# Patient Record
Sex: Female | Born: 1965 | Race: White | Hispanic: No | Smoking: Never smoker
Health system: Southern US, Community
[De-identification: ages and names within clinical notes are randomized; demographics above are authoritative.]

## PROBLEM LIST (undated history)

## (undated) DIAGNOSIS — R112 Nausea with vomiting, unspecified: Secondary | ICD-10-CM

## (undated) DIAGNOSIS — M1990 Unspecified osteoarthritis, unspecified site: Secondary | ICD-10-CM

## (undated) DIAGNOSIS — G43909 Migraine, unspecified, not intractable, without status migrainosus: Secondary | ICD-10-CM

## (undated) DIAGNOSIS — F329 Major depressive disorder, single episode, unspecified: Secondary | ICD-10-CM

## (undated) DIAGNOSIS — E538 Deficiency of other specified B group vitamins: Principal | ICD-10-CM

## (undated) DIAGNOSIS — D509 Iron deficiency anemia, unspecified: Secondary | ICD-10-CM

## (undated) DIAGNOSIS — L80 Vitiligo: Secondary | ICD-10-CM

## (undated) DIAGNOSIS — Z803 Family history of malignant neoplasm of breast: Secondary | ICD-10-CM

## (undated) DIAGNOSIS — Z8049 Family history of malignant neoplasm of other genital organs: Secondary | ICD-10-CM

## (undated) DIAGNOSIS — R413 Other amnesia: Principal | ICD-10-CM

## (undated) DIAGNOSIS — K900 Celiac disease: Secondary | ICD-10-CM

## (undated) DIAGNOSIS — E039 Hypothyroidism, unspecified: Secondary | ICD-10-CM

## (undated) DIAGNOSIS — Z800 Family history of malignant neoplasm of digestive organs: Secondary | ICD-10-CM

## (undated) DIAGNOSIS — E786 Lipoprotein deficiency: Secondary | ICD-10-CM

## (undated) DIAGNOSIS — Z9889 Other specified postprocedural states: Secondary | ICD-10-CM

## (undated) DIAGNOSIS — C801 Malignant (primary) neoplasm, unspecified: Secondary | ICD-10-CM

## (undated) DIAGNOSIS — C8590 Non-Hodgkin lymphoma, unspecified, unspecified site: Secondary | ICD-10-CM

## (undated) DIAGNOSIS — M797 Fibromyalgia: Secondary | ICD-10-CM

## (undated) DIAGNOSIS — F32A Depression, unspecified: Secondary | ICD-10-CM

## (undated) HISTORY — DX: Hypothyroidism, unspecified: E03.9

## (undated) HISTORY — DX: Celiac disease: K90.0

## (undated) HISTORY — PX: LUMBAR DISC SURGERY: SHX700

## (undated) HISTORY — DX: Unspecified osteoarthritis, unspecified site: M19.90

## (undated) HISTORY — DX: Other amnesia: R41.3

## (undated) HISTORY — DX: Deficiency of other specified B group vitamins: E53.8

## (undated) HISTORY — DX: Family history of malignant neoplasm of breast: Z80.3

## (undated) HISTORY — DX: Lipoprotein deficiency: E78.6

## (undated) HISTORY — DX: Nausea with vomiting, unspecified: R11.2

## (undated) HISTORY — PX: BACK SURGERY: SHX140

## (undated) HISTORY — DX: Family history of malignant neoplasm of digestive organs: Z80.0

## (undated) HISTORY — PX: TUBAL LIGATION: SHX77

## (undated) HISTORY — DX: Depression, unspecified: F32.A

## (undated) HISTORY — DX: Non-Hodgkin lymphoma, unspecified, unspecified site: C85.90

## (undated) HISTORY — PX: COLONOSCOPY: SHX174

## (undated) HISTORY — DX: Vitiligo: L80

## (undated) HISTORY — DX: Migraine, unspecified, not intractable, without status migrainosus: G43.909

## (undated) HISTORY — DX: Fibromyalgia: M79.7

## (undated) HISTORY — PX: INCONTINENCE SURGERY: SHX676

## (undated) HISTORY — PX: ESOPHAGOGASTRODUODENOSCOPY: SHX1529

## (undated) HISTORY — DX: Iron deficiency anemia, unspecified: D50.9

## (undated) HISTORY — DX: Family history of malignant neoplasm of other genital organs: Z80.49

## (undated) HISTORY — DX: Other specified postprocedural states: Z98.890

## (undated) HISTORY — DX: Major depressive disorder, single episode, unspecified: F32.9

## (undated) HISTORY — DX: Malignant (primary) neoplasm, unspecified: C80.1

---

## 1998-01-21 ENCOUNTER — Other Ambulatory Visit: Admission: RE | Admit: 1998-01-21 | Discharge: 1998-01-21 | Payer: Self-pay | Admitting: Gynecology

## 1998-08-07 ENCOUNTER — Other Ambulatory Visit: Admission: RE | Admit: 1998-08-07 | Discharge: 1998-08-07 | Payer: Self-pay | Admitting: Gynecology

## 1998-08-08 ENCOUNTER — Other Ambulatory Visit: Admission: RE | Admit: 1998-08-08 | Discharge: 1998-08-08 | Payer: Self-pay | Admitting: Gynecology

## 1998-12-04 ENCOUNTER — Ambulatory Visit: Admission: RE | Admit: 1998-12-04 | Discharge: 1998-12-04 | Payer: Self-pay | Admitting: Family Medicine

## 1998-12-10 ENCOUNTER — Other Ambulatory Visit: Admission: RE | Admit: 1998-12-10 | Discharge: 1998-12-10 | Payer: Self-pay | Admitting: Otolaryngology

## 1998-12-13 ENCOUNTER — Other Ambulatory Visit: Admission: RE | Admit: 1998-12-13 | Discharge: 1998-12-13 | Payer: Self-pay | Admitting: Otolaryngology

## 1998-12-24 ENCOUNTER — Other Ambulatory Visit: Admission: RE | Admit: 1998-12-24 | Discharge: 1998-12-24 | Payer: Self-pay | Admitting: Otolaryngology

## 1999-01-06 ENCOUNTER — Encounter: Payer: Self-pay | Admitting: Otolaryngology

## 1999-01-06 ENCOUNTER — Ambulatory Visit (HOSPITAL_COMMUNITY): Admission: RE | Admit: 1999-01-06 | Discharge: 1999-01-06 | Payer: Self-pay | Admitting: Otolaryngology

## 1999-03-12 ENCOUNTER — Other Ambulatory Visit: Admission: RE | Admit: 1999-03-12 | Discharge: 1999-03-12 | Payer: Self-pay | Admitting: Gynecology

## 1999-04-22 ENCOUNTER — Encounter: Payer: Self-pay | Admitting: Otolaryngology

## 1999-04-22 ENCOUNTER — Ambulatory Visit (HOSPITAL_COMMUNITY): Admission: RE | Admit: 1999-04-22 | Discharge: 1999-04-22 | Payer: Self-pay | Admitting: Otolaryngology

## 1999-09-23 ENCOUNTER — Other Ambulatory Visit: Admission: RE | Admit: 1999-09-23 | Discharge: 1999-09-23 | Payer: Self-pay | Admitting: Gynecology

## 2000-01-14 ENCOUNTER — Encounter: Payer: Self-pay | Admitting: Obstetrics and Gynecology

## 2000-01-14 ENCOUNTER — Ambulatory Visit (HOSPITAL_COMMUNITY): Admission: RE | Admit: 2000-01-14 | Discharge: 2000-01-14 | Payer: Self-pay | Admitting: Obstetrics and Gynecology

## 2000-02-09 ENCOUNTER — Ambulatory Visit (HOSPITAL_COMMUNITY): Admission: RE | Admit: 2000-02-09 | Discharge: 2000-02-09 | Payer: Self-pay | Admitting: Obstetrics and Gynecology

## 2000-02-09 ENCOUNTER — Encounter: Payer: Self-pay | Admitting: Obstetrics and Gynecology

## 2000-03-17 ENCOUNTER — Other Ambulatory Visit: Admission: RE | Admit: 2000-03-17 | Discharge: 2000-03-17 | Payer: Self-pay | Admitting: Obstetrics and Gynecology

## 2000-03-19 ENCOUNTER — Encounter: Payer: Self-pay | Admitting: Obstetrics and Gynecology

## 2000-03-19 ENCOUNTER — Ambulatory Visit (HOSPITAL_COMMUNITY): Admission: RE | Admit: 2000-03-19 | Discharge: 2000-03-19 | Payer: Self-pay

## 2000-04-09 ENCOUNTER — Inpatient Hospital Stay (HOSPITAL_COMMUNITY): Admission: AD | Admit: 2000-04-09 | Discharge: 2000-04-09 | Payer: Self-pay | Admitting: Obstetrics and Gynecology

## 2000-05-10 ENCOUNTER — Ambulatory Visit (HOSPITAL_COMMUNITY): Admission: RE | Admit: 2000-05-10 | Discharge: 2000-05-10 | Payer: Self-pay | Admitting: *Deleted

## 2000-05-10 ENCOUNTER — Inpatient Hospital Stay (HOSPITAL_COMMUNITY): Admission: AD | Admit: 2000-05-10 | Discharge: 2000-05-14 | Payer: Self-pay | Admitting: Obstetrics and Gynecology

## 2000-05-10 ENCOUNTER — Encounter: Payer: Self-pay | Admitting: Obstetrics and Gynecology

## 2000-05-10 ENCOUNTER — Encounter (INDEPENDENT_AMBULATORY_CARE_PROVIDER_SITE_OTHER): Payer: Self-pay

## 2000-05-15 ENCOUNTER — Encounter: Admission: RE | Admit: 2000-05-15 | Discharge: 2000-06-10 | Payer: Self-pay | Admitting: Obstetrics and Gynecology

## 2000-06-09 ENCOUNTER — Encounter: Admission: RE | Admit: 2000-06-09 | Discharge: 2000-06-09 | Payer: Self-pay | Admitting: Oncology

## 2000-06-09 ENCOUNTER — Encounter (HOSPITAL_COMMUNITY): Admission: RE | Admit: 2000-06-09 | Discharge: 2000-07-09 | Payer: Self-pay | Admitting: Oncology

## 2000-06-30 ENCOUNTER — Encounter (HOSPITAL_COMMUNITY): Payer: Self-pay | Admitting: Oncology

## 2000-07-13 ENCOUNTER — Encounter (HOSPITAL_COMMUNITY): Admission: RE | Admit: 2000-07-13 | Discharge: 2000-08-12 | Payer: Self-pay | Admitting: Oncology

## 2000-07-13 ENCOUNTER — Encounter: Admission: RE | Admit: 2000-07-13 | Discharge: 2000-07-13 | Payer: Self-pay | Admitting: Oncology

## 2000-08-13 ENCOUNTER — Encounter (HOSPITAL_COMMUNITY): Admission: RE | Admit: 2000-08-13 | Discharge: 2000-09-12 | Payer: Self-pay | Admitting: Oncology

## 2000-08-13 ENCOUNTER — Encounter: Admission: RE | Admit: 2000-08-13 | Discharge: 2000-08-13 | Payer: Self-pay | Admitting: Oncology

## 2000-08-25 ENCOUNTER — Encounter (HOSPITAL_COMMUNITY): Payer: Self-pay | Admitting: Oncology

## 2000-09-13 ENCOUNTER — Encounter: Admission: RE | Admit: 2000-09-13 | Discharge: 2000-09-13 | Payer: Self-pay | Admitting: Oncology

## 2000-09-13 ENCOUNTER — Encounter (HOSPITAL_COMMUNITY): Admission: RE | Admit: 2000-09-13 | Discharge: 2000-10-13 | Payer: Self-pay | Admitting: Oncology

## 2000-10-13 ENCOUNTER — Encounter (HOSPITAL_COMMUNITY): Payer: Self-pay | Admitting: Oncology

## 2000-10-22 ENCOUNTER — Encounter (HOSPITAL_COMMUNITY): Admission: RE | Admit: 2000-10-22 | Discharge: 2001-01-20 | Payer: Self-pay | Admitting: Dentistry

## 2000-10-22 ENCOUNTER — Ambulatory Visit: Admission: RE | Admit: 2000-10-22 | Discharge: 2001-01-20 | Payer: Self-pay | Admitting: Radiation Oncology

## 2000-10-23 ENCOUNTER — Encounter: Payer: Self-pay | Admitting: Emergency Medicine

## 2000-10-23 ENCOUNTER — Emergency Department (HOSPITAL_COMMUNITY): Admission: EM | Admit: 2000-10-23 | Discharge: 2000-10-23 | Payer: Self-pay | Admitting: Emergency Medicine

## 2000-11-12 ENCOUNTER — Encounter: Admission: RE | Admit: 2000-11-12 | Discharge: 2000-11-12 | Payer: Self-pay | Admitting: Oncology

## 2000-11-12 ENCOUNTER — Encounter (HOSPITAL_COMMUNITY): Admission: RE | Admit: 2000-11-12 | Discharge: 2000-12-12 | Payer: Self-pay | Admitting: Oncology

## 2000-12-16 ENCOUNTER — Encounter (HOSPITAL_COMMUNITY): Admission: RE | Admit: 2000-12-16 | Discharge: 2001-01-15 | Payer: Self-pay | Admitting: Oncology

## 2000-12-16 ENCOUNTER — Encounter: Admission: RE | Admit: 2000-12-16 | Discharge: 2000-12-16 | Payer: Self-pay | Admitting: Oncology

## 2001-02-25 ENCOUNTER — Encounter: Admission: RE | Admit: 2001-02-25 | Discharge: 2001-02-25 | Payer: Self-pay | Admitting: Oncology

## 2001-04-11 ENCOUNTER — Encounter: Admission: RE | Admit: 2001-04-11 | Discharge: 2001-04-11 | Payer: Self-pay | Admitting: Oncology

## 2001-04-11 ENCOUNTER — Encounter (HOSPITAL_COMMUNITY): Admission: RE | Admit: 2001-04-11 | Discharge: 2001-05-11 | Payer: Self-pay | Admitting: Oncology

## 2001-05-09 ENCOUNTER — Other Ambulatory Visit: Admission: RE | Admit: 2001-05-09 | Discharge: 2001-05-09 | Payer: Self-pay | Admitting: Gynecology

## 2001-05-26 ENCOUNTER — Encounter (HOSPITAL_COMMUNITY): Admission: RE | Admit: 2001-05-26 | Discharge: 2001-06-25 | Payer: Self-pay | Admitting: Oncology

## 2001-05-26 ENCOUNTER — Encounter: Admission: RE | Admit: 2001-05-26 | Discharge: 2001-05-26 | Payer: Self-pay | Admitting: Oncology

## 2001-06-22 ENCOUNTER — Encounter (HOSPITAL_COMMUNITY): Admission: RE | Admit: 2001-06-22 | Discharge: 2001-09-20 | Payer: Self-pay | Admitting: Dentistry

## 2001-07-28 ENCOUNTER — Encounter (HOSPITAL_COMMUNITY): Admission: RE | Admit: 2001-07-28 | Discharge: 2001-08-27 | Payer: Self-pay | Admitting: Oncology

## 2001-07-28 ENCOUNTER — Encounter: Admission: RE | Admit: 2001-07-28 | Discharge: 2001-07-28 | Payer: Self-pay | Admitting: Oncology

## 2001-09-15 ENCOUNTER — Encounter: Admission: RE | Admit: 2001-09-15 | Discharge: 2001-09-15 | Payer: Self-pay | Admitting: Oncology

## 2001-10-11 ENCOUNTER — Encounter (HOSPITAL_COMMUNITY): Admission: RE | Admit: 2001-10-11 | Discharge: 2001-11-10 | Payer: Self-pay | Admitting: Oncology

## 2001-10-11 ENCOUNTER — Encounter: Admission: RE | Admit: 2001-10-11 | Discharge: 2001-10-11 | Payer: Self-pay | Admitting: Oncology

## 2001-11-07 ENCOUNTER — Encounter: Payer: Self-pay | Admitting: Internal Medicine

## 2001-11-07 ENCOUNTER — Ambulatory Visit (HOSPITAL_COMMUNITY): Admission: RE | Admit: 2001-11-07 | Discharge: 2001-11-07 | Payer: Self-pay | Admitting: Internal Medicine

## 2001-12-23 ENCOUNTER — Encounter: Admission: RE | Admit: 2001-12-23 | Discharge: 2001-12-23 | Payer: Self-pay | Admitting: Oncology

## 2002-01-25 ENCOUNTER — Encounter (HOSPITAL_COMMUNITY): Admission: RE | Admit: 2002-01-25 | Discharge: 2002-02-24 | Payer: Self-pay | Admitting: Oncology

## 2002-01-25 ENCOUNTER — Encounter: Admission: RE | Admit: 2002-01-25 | Discharge: 2002-01-25 | Payer: Self-pay | Admitting: Oncology

## 2002-01-29 ENCOUNTER — Emergency Department (HOSPITAL_COMMUNITY): Admission: EM | Admit: 2002-01-29 | Discharge: 2002-01-29 | Payer: Self-pay | Admitting: Emergency Medicine

## 2002-04-07 ENCOUNTER — Encounter (HOSPITAL_COMMUNITY): Admission: RE | Admit: 2002-04-07 | Discharge: 2002-05-07 | Payer: Self-pay | Admitting: Oncology

## 2002-04-19 ENCOUNTER — Encounter (HOSPITAL_COMMUNITY): Payer: Self-pay | Admitting: Oncology

## 2002-05-29 ENCOUNTER — Encounter: Admission: RE | Admit: 2002-05-29 | Discharge: 2002-05-29 | Payer: Self-pay | Admitting: Oncology

## 2002-07-26 ENCOUNTER — Encounter (HOSPITAL_COMMUNITY): Admission: RE | Admit: 2002-07-26 | Discharge: 2002-08-25 | Payer: Self-pay | Admitting: Oncology

## 2002-07-26 ENCOUNTER — Encounter: Admission: RE | Admit: 2002-07-26 | Discharge: 2002-07-26 | Payer: Self-pay | Admitting: Oncology

## 2002-08-21 ENCOUNTER — Encounter: Payer: Self-pay | Admitting: Internal Medicine

## 2002-09-07 ENCOUNTER — Other Ambulatory Visit: Admission: RE | Admit: 2002-09-07 | Discharge: 2002-09-07 | Payer: Self-pay | Admitting: Gynecology

## 2002-10-06 ENCOUNTER — Encounter (HOSPITAL_COMMUNITY): Admission: RE | Admit: 2002-10-06 | Discharge: 2002-11-05 | Payer: Self-pay | Admitting: Oncology

## 2002-10-06 ENCOUNTER — Encounter: Admission: RE | Admit: 2002-10-06 | Discharge: 2002-10-06 | Payer: Self-pay | Admitting: Oncology

## 2002-10-12 ENCOUNTER — Encounter (HOSPITAL_COMMUNITY): Payer: Self-pay | Admitting: Oncology

## 2002-11-29 ENCOUNTER — Encounter: Admission: RE | Admit: 2002-11-29 | Discharge: 2002-11-29 | Payer: Self-pay | Admitting: Oncology

## 2003-01-10 ENCOUNTER — Encounter (HOSPITAL_COMMUNITY): Admission: RE | Admit: 2003-01-10 | Discharge: 2003-02-09 | Payer: Self-pay | Admitting: Oncology

## 2003-01-10 ENCOUNTER — Encounter: Admission: RE | Admit: 2003-01-10 | Discharge: 2003-01-10 | Payer: Self-pay | Admitting: Oncology

## 2003-03-16 ENCOUNTER — Encounter: Admission: RE | Admit: 2003-03-16 | Discharge: 2003-03-16 | Payer: Self-pay | Admitting: Oncology

## 2003-04-19 ENCOUNTER — Encounter (HOSPITAL_COMMUNITY): Admission: RE | Admit: 2003-04-19 | Discharge: 2003-05-19 | Payer: Self-pay | Admitting: Oncology

## 2003-04-19 ENCOUNTER — Encounter: Admission: RE | Admit: 2003-04-19 | Discharge: 2003-04-19 | Payer: Self-pay | Admitting: Oncology

## 2003-06-08 ENCOUNTER — Ambulatory Visit (HOSPITAL_COMMUNITY): Admission: RE | Admit: 2003-06-08 | Discharge: 2003-06-08 | Payer: Self-pay | Admitting: Internal Medicine

## 2003-06-08 ENCOUNTER — Encounter: Payer: Self-pay | Admitting: Internal Medicine

## 2003-08-16 ENCOUNTER — Encounter: Admission: RE | Admit: 2003-08-16 | Discharge: 2003-08-16 | Payer: Self-pay | Admitting: Oncology

## 2003-08-16 ENCOUNTER — Encounter (HOSPITAL_COMMUNITY): Admission: RE | Admit: 2003-08-16 | Discharge: 2003-09-15 | Payer: Self-pay | Admitting: Oncology

## 2003-10-09 ENCOUNTER — Other Ambulatory Visit: Admission: RE | Admit: 2003-10-09 | Discharge: 2003-10-09 | Payer: Self-pay | Admitting: Gynecology

## 2003-10-23 ENCOUNTER — Ambulatory Visit (HOSPITAL_COMMUNITY): Admission: RE | Admit: 2003-10-23 | Discharge: 2003-10-23 | Payer: Self-pay | Admitting: Oncology

## 2003-11-29 ENCOUNTER — Ambulatory Visit (HOSPITAL_BASED_OUTPATIENT_CLINIC_OR_DEPARTMENT_OTHER): Admission: RE | Admit: 2003-11-29 | Discharge: 2003-11-29 | Payer: Self-pay | Admitting: General Surgery

## 2004-01-03 ENCOUNTER — Ambulatory Visit: Payer: Self-pay | Admitting: Psychology

## 2004-01-15 ENCOUNTER — Encounter: Admission: RE | Admit: 2004-01-15 | Discharge: 2004-01-15 | Payer: Self-pay | Admitting: Family Medicine

## 2004-02-15 ENCOUNTER — Encounter: Admission: RE | Admit: 2004-02-15 | Discharge: 2004-02-15 | Payer: Self-pay | Admitting: Gynecology

## 2004-02-15 ENCOUNTER — Encounter (INDEPENDENT_AMBULATORY_CARE_PROVIDER_SITE_OTHER): Payer: Self-pay | Admitting: *Deleted

## 2004-04-25 ENCOUNTER — Ambulatory Visit (HOSPITAL_COMMUNITY): Admission: RE | Admit: 2004-04-25 | Discharge: 2004-04-25 | Payer: Self-pay | Admitting: Oncology

## 2004-05-15 ENCOUNTER — Ambulatory Visit: Payer: Self-pay | Admitting: Internal Medicine

## 2004-05-16 ENCOUNTER — Encounter: Admission: RE | Admit: 2004-05-16 | Discharge: 2004-05-16 | Payer: Self-pay | Admitting: Oncology

## 2004-10-23 ENCOUNTER — Other Ambulatory Visit: Admission: RE | Admit: 2004-10-23 | Discharge: 2004-10-23 | Payer: Self-pay | Admitting: Gynecology

## 2004-11-05 ENCOUNTER — Ambulatory Visit: Payer: Self-pay | Admitting: Internal Medicine

## 2004-11-06 ENCOUNTER — Ambulatory Visit (HOSPITAL_COMMUNITY): Admission: RE | Admit: 2004-11-06 | Discharge: 2004-11-06 | Payer: Self-pay | Admitting: Oncology

## 2004-11-06 ENCOUNTER — Encounter: Payer: Self-pay | Admitting: Internal Medicine

## 2004-11-11 ENCOUNTER — Ambulatory Visit (HOSPITAL_COMMUNITY): Admission: RE | Admit: 2004-11-11 | Discharge: 2004-11-11 | Payer: Self-pay | Admitting: Oncology

## 2004-11-12 ENCOUNTER — Encounter (HOSPITAL_COMMUNITY): Admission: RE | Admit: 2004-11-12 | Discharge: 2004-11-28 | Payer: Self-pay | Admitting: Oncology

## 2004-11-12 ENCOUNTER — Ambulatory Visit (HOSPITAL_COMMUNITY): Payer: Self-pay | Admitting: Oncology

## 2004-11-12 ENCOUNTER — Encounter: Admission: RE | Admit: 2004-11-12 | Discharge: 2004-11-28 | Payer: Self-pay | Admitting: Oncology

## 2004-12-09 ENCOUNTER — Ambulatory Visit (HOSPITAL_COMMUNITY): Admission: RE | Admit: 2004-12-09 | Discharge: 2004-12-09 | Payer: Self-pay | Admitting: Internal Medicine

## 2005-02-16 ENCOUNTER — Encounter: Payer: Self-pay | Admitting: Internal Medicine

## 2005-02-16 ENCOUNTER — Ambulatory Visit: Payer: Self-pay | Admitting: Internal Medicine

## 2005-02-17 ENCOUNTER — Encounter: Admission: RE | Admit: 2005-02-17 | Discharge: 2005-02-17 | Payer: Self-pay | Admitting: Oncology

## 2005-05-15 ENCOUNTER — Encounter (HOSPITAL_COMMUNITY): Admission: RE | Admit: 2005-05-15 | Discharge: 2005-06-14 | Payer: Self-pay | Admitting: Oncology

## 2005-05-15 ENCOUNTER — Ambulatory Visit (HOSPITAL_COMMUNITY): Payer: Self-pay | Admitting: Oncology

## 2005-05-15 ENCOUNTER — Encounter: Admission: RE | Admit: 2005-05-15 | Discharge: 2005-05-15 | Payer: Self-pay | Admitting: Oncology

## 2005-05-20 ENCOUNTER — Ambulatory Visit (HOSPITAL_COMMUNITY): Admission: RE | Admit: 2005-05-20 | Discharge: 2005-05-20 | Payer: Self-pay | Admitting: Oncology

## 2005-05-25 ENCOUNTER — Ambulatory Visit (HOSPITAL_COMMUNITY): Payer: Self-pay | Admitting: Oncology

## 2005-07-21 ENCOUNTER — Encounter (HOSPITAL_COMMUNITY): Admission: RE | Admit: 2005-07-21 | Discharge: 2005-08-20 | Payer: Self-pay | Admitting: Oncology

## 2005-07-21 ENCOUNTER — Ambulatory Visit (HOSPITAL_COMMUNITY): Payer: Self-pay | Admitting: Oncology

## 2005-07-21 ENCOUNTER — Encounter: Admission: RE | Admit: 2005-07-21 | Discharge: 2005-07-21 | Payer: Self-pay | Admitting: Oncology

## 2005-08-28 ENCOUNTER — Emergency Department (HOSPITAL_COMMUNITY): Admission: EM | Admit: 2005-08-28 | Discharge: 2005-08-28 | Payer: Self-pay | Admitting: Emergency Medicine

## 2005-09-15 ENCOUNTER — Encounter: Admission: RE | Admit: 2005-09-15 | Discharge: 2005-09-15 | Payer: Self-pay | Admitting: Oncology

## 2005-09-15 ENCOUNTER — Encounter (HOSPITAL_COMMUNITY): Admission: RE | Admit: 2005-09-15 | Discharge: 2005-10-15 | Payer: Self-pay | Admitting: Oncology

## 2005-11-05 ENCOUNTER — Encounter: Admission: RE | Admit: 2005-11-05 | Discharge: 2005-11-05 | Payer: Self-pay | Admitting: Gynecology

## 2005-11-05 ENCOUNTER — Other Ambulatory Visit: Admission: RE | Admit: 2005-11-05 | Discharge: 2005-11-05 | Payer: Self-pay | Admitting: Gynecology

## 2005-11-18 ENCOUNTER — Ambulatory Visit (HOSPITAL_COMMUNITY): Payer: Self-pay | Admitting: Oncology

## 2005-11-18 ENCOUNTER — Encounter: Admission: RE | Admit: 2005-11-18 | Discharge: 2005-11-27 | Payer: Self-pay | Admitting: Oncology

## 2005-11-18 ENCOUNTER — Encounter (HOSPITAL_COMMUNITY): Admission: RE | Admit: 2005-11-18 | Discharge: 2005-11-27 | Payer: Self-pay | Admitting: Oncology

## 2005-11-23 ENCOUNTER — Ambulatory Visit (HOSPITAL_COMMUNITY): Admission: RE | Admit: 2005-11-23 | Discharge: 2005-11-23 | Payer: Self-pay | Admitting: Oncology

## 2006-03-25 ENCOUNTER — Encounter: Admission: RE | Admit: 2006-03-25 | Discharge: 2006-03-25 | Payer: Self-pay | Admitting: Oncology

## 2006-08-24 ENCOUNTER — Ambulatory Visit (HOSPITAL_COMMUNITY): Payer: Self-pay | Admitting: Oncology

## 2006-08-24 ENCOUNTER — Encounter (HOSPITAL_COMMUNITY): Admission: RE | Admit: 2006-08-24 | Discharge: 2006-09-23 | Payer: Self-pay | Admitting: Oncology

## 2006-11-29 ENCOUNTER — Ambulatory Visit (HOSPITAL_COMMUNITY): Admission: RE | Admit: 2006-11-29 | Discharge: 2006-11-29 | Payer: Self-pay | Admitting: Oncology

## 2006-12-07 ENCOUNTER — Encounter: Admission: RE | Admit: 2006-12-07 | Discharge: 2006-12-07 | Payer: Self-pay | Admitting: Oncology

## 2006-12-08 ENCOUNTER — Ambulatory Visit (HOSPITAL_COMMUNITY): Payer: Self-pay | Admitting: Oncology

## 2007-01-04 ENCOUNTER — Ambulatory Visit (HOSPITAL_BASED_OUTPATIENT_CLINIC_OR_DEPARTMENT_OTHER): Admission: RE | Admit: 2007-01-04 | Discharge: 2007-01-04 | Payer: Self-pay | Admitting: Surgery

## 2007-01-04 ENCOUNTER — Encounter (INDEPENDENT_AMBULATORY_CARE_PROVIDER_SITE_OTHER): Payer: Self-pay | Admitting: Surgery

## 2007-06-17 ENCOUNTER — Encounter: Admission: RE | Admit: 2007-06-17 | Discharge: 2007-06-17 | Payer: Self-pay | Admitting: Oncology

## 2007-09-09 ENCOUNTER — Ambulatory Visit (HOSPITAL_COMMUNITY): Admission: RE | Admit: 2007-09-09 | Discharge: 2007-09-09 | Payer: Self-pay | Admitting: Family Medicine

## 2007-09-21 ENCOUNTER — Other Ambulatory Visit: Admission: RE | Admit: 2007-09-21 | Discharge: 2007-09-21 | Payer: Self-pay | Admitting: Gynecology

## 2007-11-30 ENCOUNTER — Encounter (HOSPITAL_COMMUNITY): Admission: RE | Admit: 2007-11-30 | Discharge: 2007-12-30 | Payer: Self-pay | Admitting: Oncology

## 2007-12-01 ENCOUNTER — Ambulatory Visit (HOSPITAL_COMMUNITY): Payer: Self-pay | Admitting: Oncology

## 2008-01-11 ENCOUNTER — Ambulatory Visit (HOSPITAL_COMMUNITY): Admission: RE | Admit: 2008-01-11 | Discharge: 2008-01-11 | Payer: Self-pay | Admitting: Oncology

## 2008-01-18 ENCOUNTER — Ambulatory Visit (HOSPITAL_COMMUNITY): Payer: Self-pay | Admitting: Oncology

## 2008-06-06 ENCOUNTER — Encounter (HOSPITAL_COMMUNITY): Admission: RE | Admit: 2008-06-06 | Discharge: 2008-07-06 | Payer: Self-pay | Admitting: Oncology

## 2008-06-06 ENCOUNTER — Ambulatory Visit (HOSPITAL_COMMUNITY): Payer: Self-pay | Admitting: Oncology

## 2008-09-17 ENCOUNTER — Ambulatory Visit (HOSPITAL_COMMUNITY): Admission: RE | Admit: 2008-09-17 | Discharge: 2008-09-17 | Payer: Self-pay | Admitting: Family Medicine

## 2008-10-16 ENCOUNTER — Ambulatory Visit (HOSPITAL_COMMUNITY): Admission: RE | Admit: 2008-10-16 | Discharge: 2008-10-17 | Payer: Self-pay | Admitting: Obstetrics and Gynecology

## 2008-10-16 ENCOUNTER — Encounter (INDEPENDENT_AMBULATORY_CARE_PROVIDER_SITE_OTHER): Payer: Self-pay | Admitting: Obstetrics and Gynecology

## 2008-10-31 ENCOUNTER — Encounter: Admission: RE | Admit: 2008-10-31 | Discharge: 2008-10-31 | Payer: Self-pay | Admitting: Oncology

## 2008-11-02 ENCOUNTER — Encounter: Admission: RE | Admit: 2008-11-02 | Discharge: 2008-11-02 | Payer: Self-pay | Admitting: Oncology

## 2009-01-09 ENCOUNTER — Ambulatory Visit (HOSPITAL_COMMUNITY): Admission: RE | Admit: 2009-01-09 | Discharge: 2009-01-09 | Payer: Self-pay | Admitting: Oncology

## 2009-01-11 ENCOUNTER — Encounter (HOSPITAL_COMMUNITY): Admission: RE | Admit: 2009-01-11 | Discharge: 2009-02-10 | Payer: Self-pay | Admitting: Oncology

## 2009-01-11 ENCOUNTER — Ambulatory Visit (HOSPITAL_COMMUNITY): Payer: Self-pay | Admitting: Oncology

## 2009-02-12 ENCOUNTER — Telehealth: Payer: Self-pay | Admitting: Internal Medicine

## 2009-09-30 ENCOUNTER — Encounter: Admission: RE | Admit: 2009-09-30 | Discharge: 2009-09-30 | Payer: Self-pay | Admitting: Obstetrics and Gynecology

## 2010-02-10 ENCOUNTER — Ambulatory Visit (HOSPITAL_COMMUNITY): Payer: Self-pay | Admitting: Oncology

## 2010-02-10 ENCOUNTER — Encounter (HOSPITAL_COMMUNITY)
Admission: RE | Admit: 2010-02-10 | Discharge: 2010-03-12 | Payer: Self-pay | Source: Home / Self Care | Attending: Oncology | Admitting: Oncology

## 2010-02-12 ENCOUNTER — Encounter
Admission: RE | Admit: 2010-02-12 | Discharge: 2010-02-12 | Payer: Self-pay | Source: Home / Self Care | Attending: Obstetrics and Gynecology | Admitting: Obstetrics and Gynecology

## 2010-03-22 ENCOUNTER — Encounter (HOSPITAL_COMMUNITY): Payer: Self-pay | Admitting: Oncology

## 2010-03-23 ENCOUNTER — Encounter (HOSPITAL_COMMUNITY): Payer: Self-pay | Admitting: Oncology

## 2010-03-24 ENCOUNTER — Encounter (HOSPITAL_COMMUNITY): Payer: Self-pay | Admitting: Oncology

## 2010-04-07 ENCOUNTER — Other Ambulatory Visit: Payer: Self-pay | Admitting: Obstetrics and Gynecology

## 2010-05-12 LAB — CBC
HCT: 37.1 % (ref 36.0–46.0)
Hemoglobin: 12.5 g/dL (ref 12.0–15.0)
MCV: 92.3 fL (ref 78.0–100.0)
Platelets: 480 10*3/uL — ABNORMAL HIGH (ref 150–400)
RDW: 14.5 % (ref 11.5–15.5)

## 2010-05-12 LAB — DIFFERENTIAL
Basophils Relative: 1 % (ref 0–1)
Eosinophils Absolute: 0.2 10*3/uL (ref 0.0–0.7)
Eosinophils Relative: 5 % (ref 0–5)
Lymphocytes Relative: 29 % (ref 12–46)
Lymphs Abs: 1.4 10*3/uL (ref 0.7–4.0)
Monocytes Absolute: 0.8 10*3/uL (ref 0.1–1.0)
Neutro Abs: 2.4 10*3/uL (ref 1.7–7.7)
Neutrophils Relative %: 49 % (ref 43–77)

## 2010-05-12 LAB — COMPREHENSIVE METABOLIC PANEL
AST: 34 U/L (ref 0–37)
BUN: 8 mg/dL (ref 6–23)
Calcium: 8.7 mg/dL (ref 8.4–10.5)
Chloride: 105 mEq/L (ref 96–112)
Creatinine, Ser: 0.7 mg/dL (ref 0.4–1.2)
GFR calc Af Amer: >60 mL/min (ref >60)
Total Protein: 6.1 g/dL (ref 6.0–8.3)

## 2010-05-12 LAB — FERRITIN: Ferritin: 19 ng/mL (ref 10–291)

## 2010-06-04 LAB — CBC
HCT: 36.5 % (ref 36.0–46.0)
Hemoglobin: 12.4 g/dL (ref 12.0–15.0)
MCV: 93.5 fL (ref 78.0–100.0)
Platelets: 397 10*3/uL (ref 150–400)
RBC: 3.91 MIL/uL (ref 3.87–5.11)
WBC: 6.3 10*3/uL (ref 4.0–10.5)

## 2010-06-04 LAB — COMPREHENSIVE METABOLIC PANEL
AST: 28 U/L (ref 0–37)
Albumin: 3.8 g/dL (ref 3.5–5.2)
Alkaline Phosphatase: 74 U/L (ref 39–117)
BUN: 9 mg/dL (ref 6–23)
CO2: 29 mEq/L (ref 19–32)
Chloride: 103 mEq/L (ref 96–112)
Creatinine, Ser: 0.68 mg/dL (ref 0.4–1.2)
GFR calc non Af Amer: >60 mL/min (ref >60)
Potassium: 4.1 mEq/L (ref 3.5–5.1)
Total Bilirubin: 0.5 mg/dL (ref 0.3–1.2)

## 2010-06-04 LAB — DIFFERENTIAL
Basophils Absolute: 0 10*3/uL (ref 0.0–0.1)
Basophils Relative: 1 % (ref 0–1)
Eosinophils Relative: 3 % (ref 0–5)
Lymphocytes Relative: 20 % (ref 12–46)
Monocytes Absolute: 0.7 10*3/uL (ref 0.1–1.0)
Neutro Abs: 4.1 10*3/uL (ref 1.7–7.7)

## 2010-06-04 LAB — SEDIMENTATION RATE: Sed Rate: 5 mm/hr (ref 0–22)

## 2010-06-04 LAB — GLUCOSE, CAPILLARY: Glucose-Capillary: 85 mg/dL (ref 70–99)

## 2010-06-07 LAB — CBC
HCT: 31.4 % — ABNORMAL LOW (ref 36.0–46.0)
HCT: 41.4 % (ref 36.0–46.0)
Hemoglobin: 10.6 g/dL — ABNORMAL LOW (ref 12.0–15.0)
MCHC: 33.4 g/dL (ref 30.0–36.0)
MCV: 95 fL (ref 78.0–100.0)
Platelets: 225 10*3/uL (ref 150–400)
Platelets: 314 10*3/uL (ref 150–400)
RBC: 3.28 MIL/uL — ABNORMAL LOW (ref 3.87–5.11)
RDW: 13.7 % (ref 11.5–15.5)
WBC: 14.9 10*3/uL — ABNORMAL HIGH (ref 4.0–10.5)

## 2010-06-07 LAB — PROTIME-INR: Prothrombin Time: 12.4 seconds (ref 11.6–15.2)

## 2010-06-07 LAB — URINALYSIS, ROUTINE W REFLEX MICROSCOPIC
Bilirubin Urine: NEGATIVE
Glucose, UA: NEGATIVE mg/dL
Ketones, ur: NEGATIVE mg/dL
Nitrite: NEGATIVE
Specific Gravity, Urine: <1.005 — ABNORMAL LOW (ref 1.005–1.030)
pH: 6 (ref 5.0–8.0)

## 2010-06-07 LAB — BASIC METABOLIC PANEL
BUN: 8 mg/dL (ref 6–23)
Chloride: 100 mEq/L (ref 96–112)
Creatinine, Ser: 0.61 mg/dL (ref 0.4–1.2)
Glucose, Bld: 90 mg/dL (ref 70–99)
Potassium: 3.9 mEq/L (ref 3.5–5.1)

## 2010-06-07 LAB — URINE MICROSCOPIC-ADD ON

## 2010-06-11 LAB — CBC
HCT: 35.8 % — ABNORMAL LOW (ref 36.0–46.0)
Hemoglobin: 12.2 g/dL (ref 12.0–15.0)
MCHC: 34.1 g/dL (ref 30.0–36.0)
MCV: 91.7 fL (ref 78.0–100.0)
Platelets: 336 10*3/uL (ref 150–400)
RBC: 3.9 MIL/uL (ref 3.87–5.11)
RDW: 14.1 % (ref 11.5–15.5)
WBC: 9.4 10*3/uL (ref 4.0–10.5)

## 2010-06-11 LAB — DIFFERENTIAL
Basophils Absolute: 0.1 10*3/uL (ref 0.0–0.1)
Basophils Relative: 1 % (ref 0–1)
Eosinophils Absolute: 0.2 10*3/uL (ref 0.0–0.7)
Eosinophils Relative: 2 % (ref 0–5)
Lymphocytes Relative: 18 % (ref 12–46)
Lymphs Abs: 1.7 10*3/uL (ref 0.7–4.0)
Monocytes Absolute: 1 10*3/uL (ref 0.1–1.0)
Monocytes Relative: 10 % (ref 3–12)
Neutro Abs: 6.4 10*3/uL (ref 1.7–7.7)
Neutrophils Relative %: 69 % (ref 43–77)

## 2010-06-11 LAB — COMPREHENSIVE METABOLIC PANEL
ALT: 39 U/L — ABNORMAL HIGH (ref 0–35)
Alkaline Phosphatase: 63 U/L (ref 39–117)
BUN: 11 mg/dL (ref 6–23)
CO2: 29 mEq/L (ref 19–32)
Calcium: 8.9 mg/dL (ref 8.4–10.5)
GFR calc non Af Amer: >60 mL/min (ref >60)
Glucose, Bld: 110 mg/dL — ABNORMAL HIGH (ref 70–99)
Sodium: 140 mEq/L (ref 135–145)

## 2010-06-11 LAB — LACTATE DEHYDROGENASE: LDH: 129 U/L (ref 94–250)

## 2010-06-11 LAB — SEDIMENTATION RATE: Sed Rate: 5 mm/hr (ref 0–22)

## 2010-07-15 NOTE — Op Note (Signed)
NAMEKINJAL, NEITZKE                ACCOUNT NO.:  [id number]   MEDICAL RECORD NO.:  [id number]          PATIENT TYPE:  AMB   LOCATION:  [place]                          FACILITY:  [facility]   PHYSICIAN:  Haywood Lasso, M.D.DATE OF BIRTH:  [birth year]   DATE OF PROCEDURE:  01/04/2007  DATE OF DISCHARGE:  01/04/2007                               OPERATIVE REPORT   CCS NUMBER:  [id number].   PREOPERATIVE DIAGNOSES:  Left breast mass slowly enlarging, clinically  fibroadenoma.   POSTOPERATIVE DIAGNOSES:  Left breast mass slowly enlarging, clinically  fibroadenoma.   OPERATION:  Excision of left breast mass.   SURGEON:  Dr. Margot Chimes.   ANESTHESIA:  General.   CLINICAL HISTORY:  This is a 45 year old lady who has a slowly enlarging  left breast mass.  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  However, because of the increasing size of this mass, it was elected to  do an excisional biopsy.   DESCRIPTION OF PROCEDURE:  The patient was seen in the holding area and  she had no further questions.  We identified and marked the left breast  as the operative site.   The patient was taken to the operating room and the mass itself was  identified by the patient and myself and marked.  After that was done,  the patient underwent satisfactory general anesthesia.  The left breast  was prepped and draped.  The time-out was performed.   To help with postop analgesia, I infiltrated 0.25% plain Marcaine around  the mass.  It was in the 1 o'clock position and I thought I might be  able to get it through a circumareolar incision but after the patient  was asleep and placed on the table, I elected to make an incision  directly over about 1.5 cm from the areolar edge as it seemed to be a  little bit more lateral and I wanted to be sure we were able to get this  without having it move out of the way.   Once the incision was made, I was able to identify the mass and put a  suture through it.  Using  this with traction, I was able to pull up and  get the mass completely removed using cautery.   I put more local in. I made sure everything was dry.  I closed with 3-0  Vicryl, 4-0 Monocryl, subcuticular and Dermabond.   The patient tolerated the procedure well and there were no operative  complications.  All counts were correct.      Haywood Lasso, M.D.  Electronically Signed     CJS/MEDQ  D:  01/04/2007  T:  01/05/2007  Job:  [id number]   cc:   Nicki Reaper A. Wolfgang Phoenix, MD  Gaston Islam. Tressie Stalker, MD

## 2010-07-15 NOTE — Op Note (Signed)
NAMEIYANI, DRESNER                ACCOUNT NO.:  [id number]   MEDICAL RECORD NO.:  [id number]          PATIENT TYPE:  OIB   LOCATION:  9318                          FACILITY:  [facility]   PHYSICIAN:  Lenard Galloway, M.D.   DATE OF BIRTH:  Jun 28, 1965   DATE OF PROCEDURE:  10/16/2008  DATE OF DISCHARGE:                               OPERATIVE REPORT   PREOPERATIVE DIAGNOSES:  1. Left lower quadrant pain.  2. Desire for permanent sterilization.  3. Genuine stress incontinence.  4. Vulvar nevi.   POSTOPERATIVE DIAGNOSES:  1. Left lower quadrant pain.  2. Mild endometriosis.  3. Desire for permanent sterilization.  4. Genuine stress incontinence.  5. Vulvar nevi.   PROCEDURES:  Laparoscopic bilateral tubal ligation with fulguration of  endometriosis, tension-free vaginal tape mid urethral sling, cystoscopy,  vulvar biopsy.   SURGEON:  Lenard Galloway, MD   ASSISTANT:  Genice Rouge, PA-C   ANESTHESIA:  General endotracheal, local, 0.25% Marcaine abdominally and  0.5% lidocaine with epinephrine 1:200,000 vaginally.   IV FLUIDS:  1600 mL of Ringer's lactate.   ESTIMATED BLOOD LOSS:  150 mL.   URINE OUTPUT:  625 mL.   COMPLICATIONS:  None.   INDICATIONS FOR THE PROCEDURE:  The patient is a 45 year old para 2,  Caucasian female who presented with left lower quadrant pain and  dyspareunia.  The patient has had a previous ultrasound with an  ultrasound Josslyn Ciolek with that had documented a left ovarian cyst  measuring 1.3 cm.  The patient presented with a request for permanent  sterilization.  She was also experiencing urinary incontinence with  stressful maneuvers and underwent urodynamic testing, which confirmed  the presence of genuine stress incontinence with a leak point pressure  of 97 cm of water.  The patient also was reporting pigmented nevi of the  vulva and wished for sampling of a few of the multiple areas present to  rule out dysplasia or carcinoma.  A plan is now made to  proceed with a  laparoscopic-bilateral tubal ligation and evaluation of left lower  quadrant pain with possible left ovarian cystectomy.  The patient will  also proceed with a tension-free vaginal tape mid urethral sling,  cystoscopy, and vulvar biopsies.  Risks, benefits, and alternatives to  the above procedure have been reviewed with the patient.   The patient understands that there is a failure rate of the tubal  ligation of approximately 1:250 and 1:300 which may result in either an  intrauterine or ectopic pregnancy.  The patient chooses to proceed with  all of the above surgeries.   FINDINGS:  Examination of the external genitalia revealed multiple brown  nevi of the bilateral labia, but most prominently on the labia minora.   The cervix was examined.  There was a question of possible endometriosis  present.  The patient is currently up-to-date on her Pap smears and they  have been normal.   Laparoscopy demonstrated a normal-appearing uterus, bilateral tubes, and  bilateral ovaries.  There were some vesicular lesions of endometriosis  in the posterior cul-de-sac in the midline.  These  lesions were treated  with fulguration using bipolar cautery.   In the upper abdomen, the liver, gallbladder, and bowel all appeared to  be unremarkable.  There was no evidence of any adhesive disease in the  abdomen or in the pelvis.   Cystoscopy with the sling procedure documented no foreign body in the  bladder and the urethra.  The bladder was visualized throughout 360  degrees including the bladder dome and trigone.  The ureters were noted  to be patent bilaterally after the injection of indigo carmine dye IV.   SPECIMENS:  Specimens will be 2 biopsies of the labia minora were sent  to Pathology together.   DESCRIPTION OF PROCEDURE:  The patient was reidentified in the  preoperative hold area.  She received ciprofloxacin 400 mg IV for  antibiotic prophylaxis.  She received both TED hose  and PAS stockings  for DVT prophylaxis.   In the operating room, general endotracheal anesthesia was induced and  the patient was then placed in the dorsal lithotomy position.  The  abdomen, vagina, and perineum were then sterilely prepped and draped.  A  Foley catheter was placed inside the bladder.  A speculum was placed  inside the vagina and a single-tooth tenaculum was placed on the  anterior cervical lip.  This was replaced with a Hulka tenaculum.  The  speculum was removed.   Attention was turned to the abdomen where 1-cm umbilical incision was  created sharply with a scalpel.  The dissection was carried down to the  fascia.  A 10-mm trocar was inserted directly into the perineal cavity  without difficulty.  The laparoscope confirmed proper placement.  A CO2  pneumoperitoneum was achieved and the patient was then placed in the  Trendelenburg position.  A 5-mm suprapubic incision was then created 2  fingerbreadths above the pubic symphysis.  A 5-mm trocar was inserted  under direct visualization of the laparoscope.  An inspection of the  pelvic and abdominal organs was performed and the findings are as noted  above.   The right fallopian tube was grasped and followed to its fimbriated end.  It was then grasped along the isthmic portion and was cauterized with  bipolar cautery over 3-cm contiguous segment of fallopian tube.  There  was good burn into the mesosalpinx.  The same procedure performed on the  right fallopian tube and was repeated on the left fallopian tube after  it was followed to its fimbriated end.   Attention was then turned to the posterior cul-de-sac, which was  suctioned of some bloody peritoneal fluid.  The lesions of endometriosis  were appreciated and these were cauterized with bipolar cautery.   This completed the laparoscopic portion of the procedure.  The  laparoscopic instruments were removed and the pneumoperitoneum was  released.  The incisions were  closed with subcuticular sutures of 3-0  plain gut suture.  A sterile bandage was placed over the umbilical  incision and Dermabond was later placed over the suprapubic incision.   Attention was then turned to the vaginal portion of the procedure.  A  weighted speculum was placed inside the vagina.  Allis clamps were used  to mark the midline of the anterior vaginal mucosa from a level of 1 cm  below the urethral meatus down to 5 cm below the urethral meatus.  The  mucosa was injected locally with 0.% lidocaine with 1:200,000 of  epinephrine.  The vaginal mucosa was then incised vertically in the  midline  with a scalpel.  Sharp and blunt dissection were used to dissect  the mucosa from the underlying subvaginal tissue and bladder.  The  dissection was carried back to the pubic rami bilaterally.  Hemostasis  during the dissection was performed with monopolar cautery.   The suprapubic incisions were then created 2 cm to the right and left of  the midline.  The abdominal needle passer was then placed through the  small right suprapubic incision and out through the endopelvic fascia on  the ipsilateral side and at the level of the mid urethra.  The same  procedure was performed on the left-hand side.   The Foley catheter was removed and cystoscopy was performed and the  findings were as noted above.  The bladder was then drained of  cystoscopic fluid and the Foley catheter was replaced.  The sling was  attached to the abdominal needle passers and was drawn up through the  suprapubic incisions bilaterally.  The sling was adjusted to be in  proper position.  A Kelly clamp was then placed between the sling and  the urethra and the plastic sheaths were removed.  Excess sling was  trimmed suprapubically.  The sling was noted to be in excellent  position.   Attention was turned to a bleeding vein over the surface of the bladder  in the midline, which responded to placement of a figure-of-eight  suture  of 2-0 Vicryl.  There was also some bleeding noted from the exit site of  the sling on the patient's right-hand side back near the pubic  symphysis.  Again, a figure-of-eight suture of 2-0 Vicryl created  acceptable hemostasis.  One colporrhaphy suture of 2-0 Vicryl was placed  in a vertical fashion.  Excess mucosa was trimmed.  Gelfoam was placed  near the exit site of the sling on the patient's right-hand side.  Hemostasis was acceptable at this time, and the vagina was therefore  closed with a running locked suture of 2-0 Vicryl.  A vaginal packing  with Estrace cream was placed inside the vagina and the Foley catheter  was left to gravity drainage.   The suprapubic incisions were then closed with Dermabond.  Two punch  biopsies were performed of the labia minora.  The tissue was sent to  Pathology.  The base of the punch biopsy sites were treated with  monopolar cautery in a single suture of 2-0 Vicryl was placed over one  of the biopsy sites to create closure and approximation of the skin  edges.   This concluded the patient's procedure.  There were no complications.  All needle, instrument, and sponge counts were correct.  The patient was  escorted to the recovery room in stable and awake condition.      Lenard Galloway, M.D.  Electronically Signed     BES/MEDQ  D:  10/16/2008  T:  10/17/2008  Job:  [id number]

## 2010-07-18 NOTE — Group Therapy Note (Signed)
NAMEJEROLYN, [name]                ACCOUNT NO.:  [id number]   MEDICAL RECORD NO.:  [id number]          PATIENT TYPE:  EMS   LOCATION:  [facility]                            FACILITY:  [facility]   PHYSICIAN:  Scott A. Wolfgang Phoenix, MD    DATE OF BIRTH:  1965/07/31   DATE OF PROCEDURE:  DATE OF DISCHARGE:  08/28/2005                                   PROGRESS NOTE   CHIEF COMPLAINT:  The patient presents to the office initially with severe  abdominal pain and discomfort, been going on for approximately a couple  days, keeping the patient up at night, no fevers, no true diarrhea,  vomiting, some nausea.  The pain is just so severe she cannot get any  relief.  Denies dysuria.  Initial examination in the office showed  significant tenderness in the abdomen and it is felt she needed further  testing done.  It is felt the most straightforward way plus expeditious way  of doing this would be through the emergency department.  It is felt that  this is medically necessary.   PAST MEDICAL HISTORY:  Lymphoma.   PHYSICAL EXAMINATION:  GENERAL:  NAD.  HEENT:  Benign.  CHEST:  CTA.  HEART:  Regular.  ABDOMEN:  Soft, tenderness, no guarding or rebound.  EXTREMITIES:  No edema.  SKIN:  Warm and dry.   LABORATORY WORK:  Lab work overall looks good.   CT scan shows jejunal thickening with some mesenteric lymph node increase.  This could be a viral process versus even the possibility of a tumor  according to radiology.   ASSESSMENT AND PLAN:  Abdominal pain.  Patient only tolerates Demerol for  pain.  She was given Demerol eight tablets one q.4-6 h p.r.n. pain, recheck  on Monday morning and to reassess how she is doing at that point in time.  If the pain is still persistent and severe we may well need to refer her to  specialty to get further evaluation.  I doubt that this is a reoccurrence of  her cancer but I cannot exclude that at this point.  Most likely this is a  viral illness.  So therefore, recheck  patient on Monday and act accordingly.      Scott A. Wolfgang Phoenix, MD  Electronically Signed     SAL/MEDQ  D:  08/29/2005  T:  08/29/2005  Job:  [id number]

## 2010-07-18 NOTE — Discharge Summary (Signed)
[place]  Patient:    [name], [name] Visit Number: [id number] MRN: [id number]          Service Type: REC Location: [facility] Attending Physician:  Burnett Kanaris Admit Date:  02/25/2001 Discharge Date: 03/27/2001                             Discharge Summary  ADMISSION DIAGNOSES:          1. Intrauterine pregnancy at 35+ weeks.                               2. Lymphoma.  DISCHARGE DIAGNOSES:          1. Intrauterine pregnancy at 36 weeks.                               2. Lymphoma.  PROCEDURES:                   Spontaneous vaginal delivery after cervical                               ripening and induction of labor.  COMPLICATIONS:                None.  CONSULTATIONS:                None.  HISTORY AND PHYSICAL:         This is a 45 year old white female, gravida 2, para 1-0-0-1 with an EGA of 35+ weeks by an LMP consistent with an 8-week ultrasound with an [facility] of April 10, who presents for induction due to recent diagnosis of lymphoma and mature fetal lungs by an amniocentesis with an FLM of 67.7. Prenatal care complicated by enlarged lymph nodes followed at [facility] in [place]. These were initially thought to be due to bacterial infection and she was treated with amoxicillin. She also had an HSV outbreak at approximately 20 weeks, treated with acyclovir, and anemia treated with iron. Her lymph nodes continued to grow and she had a biopsy done last week which revealed lymphoma, and due to this, she wished to proceed with delivery as soon as possible so that she could start treatment. Amniocentesis was done on the day of admission, May 10, 2000.  PRENATAL LABORATORIES:        Blood type is O positive with a negative antibody screen. RPR nonreactive. Rubella immune. Hepatitis B surface antigen negative. HIV negative. Gonorrhea and Chlamydia negative. Pap smear was normal and triple screen is normal.  OBSTETRIC  HISTORY:            1988, vaginal delivery at 42 weeks, 7 pounds, no complications.  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  PAST SURGICAL HISTORY:        1988, she had a lumbar diskectomy and then she had lymph node surgery twice.  ALLERGIES:                    CODEINE.  MEDICATIONS:                  Currently, she is taking some oral Demerol for pain.  PHYSICAL EXAMINATION:         She is afebrile with stable vital signs. Her neck has a large lymph node in the left lower neck and several smaller ones on her left lateral neck. Fetal heart tracing is reactive with occasional contractions. Abdomen is gravid, nontender with an estimated fetal weight of 5-1/2 pounds. Vaginal exam is closed, soft, posterior, 70% effaced with a -3 station at a vertex presentation.  HOSPITAL COURSE:              The patient was admitted and due to her unfavorable cervix, Cervidil was placed for cervical ripening. She also decided that when she did deliver, she was going to collect cord blood for stem cells. On the morning of March 12, her cervix was essentially unchanged, although the vertex had descended just a little bit. Cervidil was removed and she was started on high-dose Pitocin. Throughout the day on March 12 she did contract regularly but her cervix did not change. That evening Dr. Marvel Plan examined her and placed another Cervidil. She did well overnight with some irregular contractions and early on the morning of March 13, Dr. Marvel Plan examined her and placed a Cytotec. I then examined her later that morning, again felt her cervix was not changed and started her on Pitocin. At late morning on March 13, I examined her with a speculum and was able to pass a tonsil clamp into the cervical os and mechanically stretch it. After I did this I was able to insert a finger through the cervical os and she was 1, 80, and -3  with a vertex presentation and her membranes were stripped. Later that afternoon she progressed to 1-2 and I was able to perform artificial rupture of membranes. She then was continued on the Pitocin and started on penicillin prophylaxis for prematurity for Group B strep. Internal monitors were placed to more accurately assess the baby. She also received and epidural and progressed into active labor. That evening, she reached complete and pushed well. She had an SVD of a viable female infant with Apgars of 4 and 10 that weighed 6 pounds over a second-degree midline episiotomy and right labial laceration, and she delivered through a nuchal cord. Dr. Norval Gable was present at the delivery and the baby was taken to the newborn nursery early. Placenta delivered spontaneous and was intact and was sent to pathology for examination due to the patients history of lymphoma. Cord blood was collected for the patient. Her right labial laceration and second-degree midline episiotomy were repaired with 3-0 Vicryl and estimated blood loss was less than 500 cc.  Postpartum she did very well. She breast fed her baby without complications. On the evening of postpartum day #1 the baby was having some feeding problems and was kept in the nursery overnight. On the morning of postpartum day #2 the patient was stable for discharge home.  CONDITION ON DISCHARGE:       Stable.  DISPOSITION:                  Discharge to home.  DISCHARGE INSTRUCTIONS:       Her diet is regular. Her activity is pelvic rest. She was also given our discharge pamphlet.  DISCHARGE FOLLOWUP:           Her followup is in four to six weeks.  DISCHARGE MEDICATIONS:        Motrin 600 mg p.o. q.6h. p.r.n. pain. Attending Physician:  Burnett Kanaris DD:  05/14/00  TD:  05/14/00 Job: [id number]

## 2010-07-18 NOTE — Op Note (Signed)
NAMEPAIJE, GOODHART                ACCOUNT NO.:  [id number]   MEDICAL RECORD NO.:  [id number]          PATIENT TYPE:  AMB   LOCATION:  [place]                          FACILITY:  [facility]   PHYSICIAN:  Odis Hollingshead, M.D.DATE OF BIRTH:  1966/02/14   DATE OF PROCEDURE:  11/29/2003  DATE OF DISCHARGE:                                 OPERATIVE REPORT   PREOPERATIVE DIAGNOSIS:  Retained Port-A-Cath right subclavian area.   POSTOPERATIVE DIAGNOSIS:  Retained Port-A-Cath right subclavian area.   OPERATION PERFORMED:  Removal of Port-A-Cath.   SURGEON:  Odis Hollingshead, M.D.   ANESTHESIA:  Local 1% lidocaine with epinephrine plus 0.5% plain Marcaine  plus sodium bicarbonate with MAC.   INDICATIONS FOR PROCEDURE:  Ms. Delage is a 45 year old female with non-  Hodgkin's lymphoma who in remission now and has completed her treatments.  She now presents for Port-A-Cath removal.  The procedure and risks including  but not limited to bleeding, infection, and anesthesia were explained to  her.   She was seen in the holding area and the right chest wall marked.   DESCRIPTION OF PROCEDURE:  She was then brought to the operating room,  placed supine on the operating table and given intravenous sedation.  The  right upper chest wall area was sterilely prepped and draped.  She had a  hypertrophic scar present.  I injected local anesthetic superficially and  deep and excised the hypertrophic scar.  Sharp dissection was used to  incised to through the subcutaneous tissue and the capsule around the Port-A-  Cath.  The catheter itself was identified and was removed.  Direct pressure  was held in the right subclavian area.   Subsequenty, I dissected the capsule free from the Port-A-Cath and removed  some Prolene type suture.  The port was then removed as well with the  catheter.  Hemostasis appeared to be adequate.  I injected more local  anesthetic in the deeper subcutaneous tissues.  I then closed  the  subcutaneous tissue with a running 4-0 Monocryl suture and closed the skin  with a running 4-0 Monocryl subcuticular suture.  Steri-Strips and a sterile  dressing were applied.  The patient tolerated the procedure well without any  apparent complications and was taken to the recovery room in satisfactory  condition.  Postoperative instructions have been given to her including  removing the bandage in three days, removing the Steri-Strips in one week,  starting to apply Mederma in one week.       TJR/MEDQ  D:  11/29/2003  T:  11/29/2003  Job:  [id number]

## 2010-09-05 ENCOUNTER — Ambulatory Visit (HOSPITAL_COMMUNITY): Admission: RE | Admit: 2010-09-05 | Payer: 59 | Source: Ambulatory Visit

## 2010-12-01 LAB — COMPREHENSIVE METABOLIC PANEL
ALT: 39 — ABNORMAL HIGH
Albumin: 3.9
Alkaline Phosphatase: 78
GFR calc Af Amer: >60
Potassium: 4.2
Sodium: 139
Total Protein: 6.3

## 2010-12-01 LAB — DIFFERENTIAL
Lymphs Abs: 1.8
Monocytes Relative: 10
Neutro Abs: 6.1
Neutrophils Relative %: 67

## 2010-12-01 LAB — LACTATE DEHYDROGENASE: LDH: 143

## 2010-12-01 LAB — FERRITIN: Ferritin: 8 — ABNORMAL LOW (ref 10–291)

## 2010-12-01 LAB — CBC
Platelets: 385
RDW: 14.7

## 2010-12-02 LAB — GLUCOSE, CAPILLARY: Glucose-Capillary: 92

## 2010-12-09 LAB — POCT HEMOGLOBIN-HEMACUE
Hemoglobin: 12.8
Operator id: 128471

## 2010-12-17 LAB — CBC
Platelets: 334
RDW: 14.2 — ABNORMAL HIGH

## 2010-12-17 LAB — COMPREHENSIVE METABOLIC PANEL
ALT: 45 — ABNORMAL HIGH
Albumin: 3.6
Alkaline Phosphatase: 78
Potassium: 4.7
Sodium: 140
Total Protein: 6

## 2010-12-17 LAB — DIFFERENTIAL
Basophils Relative: 0
Eosinophils Absolute: 0.2
Lymphs Abs: 1.1
Monocytes Absolute: 0.6
Monocytes Relative: 11
Neutro Abs: 3.7

## 2010-12-17 LAB — LACTATE DEHYDROGENASE: LDH: 151

## 2011-02-17 ENCOUNTER — Other Ambulatory Visit (HOSPITAL_COMMUNITY): Payer: Self-pay

## 2011-02-18 ENCOUNTER — Ambulatory Visit (HOSPITAL_COMMUNITY): Payer: Self-pay | Admitting: Oncology

## 2011-03-30 ENCOUNTER — Other Ambulatory Visit: Payer: Self-pay | Admitting: Family Medicine

## 2011-03-30 DIAGNOSIS — Z1231 Encounter for screening mammogram for malignant neoplasm of breast: Secondary | ICD-10-CM

## 2011-04-02 ENCOUNTER — Ambulatory Visit
Admission: RE | Admit: 2011-04-02 | Discharge: 2011-04-02 | Disposition: A | Discharge status: Home / Self Care | Payer: 59 | Source: Ambulatory Visit | Attending: Family Medicine | Admitting: Family Medicine

## 2011-04-02 DIAGNOSIS — Z1231 Encounter for screening mammogram for malignant neoplasm of breast: Secondary | ICD-10-CM

## 2011-04-03 ENCOUNTER — Other Ambulatory Visit: Payer: Self-pay | Admitting: Family Medicine

## 2011-04-03 DIAGNOSIS — R928 Other abnormal and inconclusive findings on diagnostic imaging of breast: Secondary | ICD-10-CM

## 2011-04-09 ENCOUNTER — Ambulatory Visit
Admission: RE | Admit: 2011-04-09 | Discharge: 2011-04-09 | Disposition: A | Discharge status: Home / Self Care | Payer: 59 | Source: Ambulatory Visit | Attending: Family Medicine | Admitting: Family Medicine

## 2011-04-09 ENCOUNTER — Other Ambulatory Visit: Payer: Self-pay | Admitting: Family Medicine

## 2011-04-09 DIAGNOSIS — R928 Other abnormal and inconclusive findings on diagnostic imaging of breast: Secondary | ICD-10-CM

## 2011-06-19 ENCOUNTER — Other Ambulatory Visit: Payer: Self-pay | Admitting: Family Medicine

## 2011-06-19 DIAGNOSIS — R200 Anesthesia of skin: Secondary | ICD-10-CM

## 2011-06-19 DIAGNOSIS — R413 Other amnesia: Secondary | ICD-10-CM

## 2011-06-24 ENCOUNTER — Ambulatory Visit (HOSPITAL_COMMUNITY)
Admission: RE | Admit: 2011-06-24 | Discharge: 2011-06-24 | Disposition: A | Discharge status: Home / Self Care | Payer: 59 | Source: Ambulatory Visit | Attending: Family Medicine | Admitting: Family Medicine

## 2011-06-24 DIAGNOSIS — R200 Anesthesia of skin: Secondary | ICD-10-CM

## 2011-06-24 DIAGNOSIS — R209 Unspecified disturbances of skin sensation: Secondary | ICD-10-CM | POA: Insufficient documentation

## 2011-06-24 DIAGNOSIS — R413 Other amnesia: Secondary | ICD-10-CM | POA: Insufficient documentation

## 2011-07-01 ENCOUNTER — Other Ambulatory Visit: Payer: Self-pay | Admitting: Obstetrics and Gynecology

## 2011-07-10 ENCOUNTER — Encounter (HOSPITAL_COMMUNITY): Payer: Self-pay | Admitting: Oncology

## 2011-07-10 ENCOUNTER — Encounter (HOSPITAL_COMMUNITY): Payer: 59 | Attending: Oncology | Admitting: Oncology

## 2011-07-10 VITALS — BP 119/72 | HR 87 | Temp 98.2°F | Wt 156.2 lb

## 2011-07-10 DIAGNOSIS — Z801 Family history of malignant neoplasm of trachea, bronchus and lung: Secondary | ICD-10-CM | POA: Insufficient documentation

## 2011-07-10 DIAGNOSIS — R413 Other amnesia: Secondary | ICD-10-CM

## 2011-07-10 DIAGNOSIS — C8590 Non-Hodgkin lymphoma, unspecified, unspecified site: Secondary | ICD-10-CM

## 2011-07-10 DIAGNOSIS — IMO0001 Reserved for inherently not codable concepts without codable children: Secondary | ICD-10-CM | POA: Insufficient documentation

## 2011-07-10 DIAGNOSIS — Z803 Family history of malignant neoplasm of breast: Secondary | ICD-10-CM | POA: Insufficient documentation

## 2011-07-10 DIAGNOSIS — E039 Hypothyroidism, unspecified: Secondary | ICD-10-CM | POA: Insufficient documentation

## 2011-07-10 DIAGNOSIS — C8589 Other specified types of non-Hodgkin lymphoma, extranodal and solid organ sites: Secondary | ICD-10-CM

## 2011-07-10 LAB — COMPREHENSIVE METABOLIC PANEL
Alkaline Phosphatase: 73 U/L (ref 39–117)
BUN: 10 mg/dL (ref 6–23)
CO2: 23 mEq/L (ref 19–32)
Chloride: 103 mEq/L (ref 96–112)
Creatinine, Ser: 0.54 mg/dL (ref 0.50–1.10)
GFR calc non Af Amer: >90 mL/min (ref >90)
Total Bilirubin: 0.3 mg/dL (ref 0.3–1.2)

## 2011-07-10 LAB — DIFFERENTIAL
Lymphocytes Relative: 26 % (ref 12–46)
Lymphs Abs: 2 10*3/uL (ref 0.7–4.0)
Monocytes Absolute: 1.1 10*3/uL — ABNORMAL HIGH (ref 0.1–1.0)
Monocytes Relative: 15 % — ABNORMAL HIGH (ref 3–12)
Neutro Abs: 4.3 10*3/uL (ref 1.7–7.7)

## 2011-07-10 LAB — CBC
HCT: 37 % (ref 36.0–46.0)
Hemoglobin: 12.2 g/dL (ref 12.0–15.0)
RBC: 4.01 MIL/uL (ref 3.87–5.11)
WBC: 7.5 10*3/uL (ref 4.0–10.5)

## 2011-07-10 LAB — LACTATE DEHYDROGENASE: LDH: 214 U/L (ref 94–250)

## 2011-07-10 NOTE — Patient Instructions (Signed)
[name]  [id number] 1965-06-18 Dr. Everardo All   [facility]  Discharge Instructions  RECOMMENDATIONS MADE BY THE CONSULTANT AND ANY TEST RESULTS WILL BE SENT TO YOUR REFERRING DOCTOR.   EXAM FINDINGS BY MD TODAY AND SIGNS AND SYMPTOMS TO REPORT TO CLINIC OR PRIMARY MD: Exam and discussion per MD.  You are doing well from our standpoint.  Will check some labs today and if anything is abnormal we will call you.  MEDICATIONS PRESCRIBED: none   INSTRUCTIONS GIVEN AND DISCUSSED: Other :  Report any new lumps, bone pain, shortness of breath, night sweats, fevers, recurring infections, etc.  SPECIAL INSTRUCTIONS/FOLLOW-UP: Lab work Needed today and Return to Clinic to see MD in 1 year.   I acknowledge that I have been informed and understand all the instructions given to me and received a copy. I do not have any more questions at this time, but understand that I may call the Specialty Clinic at [facility] at [phone] during business hours should I have any further questions or need assistance in obtaining follow-up care.    __________________________________________  _____________  __________ Signature of Patient or Authorized Representative            Date                   Time    __________________________________________ Nurse's Signature

## 2011-07-10 NOTE — Progress Notes (Signed)
CC:   Scott A. Wolfgang Phoenix, MD Lenard Galloway, M.D. Jill Alexanders, M.D.  DIAGNOSES: 1. 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 Nila Nephew     and a mini mantle setup presenting here for the first time on     05/21/2000, finishing chemotherapy as of 09/22/2000, then     radiation. 2. Memory issues and she is going to see Dr. Floyde Parkins next week.     She is having trouble with some significant issues. 3. Iron deficiency in the past. 4. Postpartum depression in the past. 5. Fibromyalgia which she states is a new diagnosis by Dr. Amil Amen in     [place]. 6. Codeine intolerance. 7. Benign lump in her breast status post resection. 8. Hypothyroidism status post radiation and she is on replacement     Synthroid now 100 mcg a day, and her TSH has not been checked in 3     months she thinks so we will check that. 9. Two uneventful pregnancies, labor and deliveries with 2 sons both     in good health.  One is 45 and one is in his early 7s. 10.Intermittent left hip discomfort in the past.  Julee is here today without any physical symptomatology other than aching, weakness and fatigue and she is a little depressed I think from this diagnosis of fibromyalgia but she is worried more about her memory as is her family.  Both her sons and her husband are well aware of it. She also tells me that her aunt, namely her mother's sister, has a recent diagnosis of breast cancer with positive nodes, and of course her mother had breast cancer and lung cancer and that her maternal uncle died of a sarcoma though he was supposedly HIV positive but there is lung cancer in both the maternal grandmother and paternal grandmother had breast cancer and the maternal grandmother had lung cancer, and so her aunt with a newly diagnosed breast cancer is having BRCA1 and BRCA2 testing which I think is very important for [place].  Marki is  still constipated at times, that is nothing new or different. Her weight is fairly stable at 156 pounds.  She has not actually been here in a year and a half because she has not felt well when it came time for her appointment in December.  Her labs are pending for the day but her vital signs are all stable. Lymph nodes are negative throughout.  Breast exam is negative bilaterally for any masses.  Her lungs are clear.  Heart shows a regular rhythm and rate without murmur, rub or gallop.  Abdomen is soft, nontender without organomegaly.  Bowel sounds are diminished but present.  She has no peripheral edema.  So we will see her back in a year but will get some blood work, will share with Dr. Wolfgang Phoenix of course and Dr. Jannifer Franklin and I have encouraged her to absolutely keep her appointment with Dr. Jannifer Franklin and please to take her husband with her when she goes for that appointment.    ______________________________ Gaston Islam. Tressie Stalker, MD ESN/MEDQ  D:  07/10/2011  T:  07/10/2011  Job:  [id number]

## 2011-07-10 NOTE — Progress Notes (Signed)
This office note has been dictated.

## 2011-07-11 LAB — TSH: TSH: 2.282 u[IU]/mL (ref 0.350–4.500)

## 2012-02-08 ENCOUNTER — Other Ambulatory Visit: Payer: Self-pay | Admitting: Family Medicine

## 2012-02-10 ENCOUNTER — Other Ambulatory Visit: Payer: Self-pay | Admitting: Family Medicine

## 2012-02-10 DIAGNOSIS — N644 Mastodynia: Secondary | ICD-10-CM

## 2012-02-19 ENCOUNTER — Ambulatory Visit
Admission: RE | Admit: 2012-02-19 | Discharge: 2012-02-19 | Disposition: A | Discharge status: Home / Self Care | Payer: 59 | Source: Ambulatory Visit | Attending: Family Medicine | Admitting: Family Medicine

## 2012-02-19 ENCOUNTER — Other Ambulatory Visit: Payer: Self-pay | Admitting: Family Medicine

## 2012-02-19 DIAGNOSIS — N644 Mastodynia: Secondary | ICD-10-CM

## 2012-04-06 ENCOUNTER — Ambulatory Visit (HOSPITAL_COMMUNITY)
Admission: RE | Admit: 2012-04-06 | Discharge: 2012-04-06 | Disposition: A | Discharge status: Home / Self Care | Payer: 59 | Source: Ambulatory Visit | Attending: Family Medicine | Admitting: Family Medicine

## 2012-04-06 ENCOUNTER — Other Ambulatory Visit: Payer: Self-pay | Admitting: Family Medicine

## 2012-04-06 DIAGNOSIS — M546 Pain in thoracic spine: Secondary | ICD-10-CM | POA: Insufficient documentation

## 2012-04-06 DIAGNOSIS — M549 Dorsalgia, unspecified: Secondary | ICD-10-CM

## 2012-04-19 ENCOUNTER — Other Ambulatory Visit (HOSPITAL_COMMUNITY): Payer: Self-pay | Admitting: Family Medicine

## 2012-04-19 DIAGNOSIS — C8590 Non-Hodgkin lymphoma, unspecified, unspecified site: Secondary | ICD-10-CM

## 2012-04-19 DIAGNOSIS — M549 Dorsalgia, unspecified: Secondary | ICD-10-CM

## 2012-04-21 ENCOUNTER — Ambulatory Visit (HOSPITAL_COMMUNITY): Payer: 59

## 2012-04-21 ENCOUNTER — Ambulatory Visit (HOSPITAL_COMMUNITY)
Admission: RE | Admit: 2012-04-21 | Discharge: 2012-04-21 | Disposition: A | Discharge status: Home / Self Care | Payer: 59 | Source: Ambulatory Visit | Attending: Family Medicine | Admitting: Family Medicine

## 2012-04-21 DIAGNOSIS — Z87898 Personal history of other specified conditions: Secondary | ICD-10-CM | POA: Insufficient documentation

## 2012-04-21 DIAGNOSIS — R937 Abnormal findings on diagnostic imaging of other parts of musculoskeletal system: Secondary | ICD-10-CM | POA: Insufficient documentation

## 2012-04-21 DIAGNOSIS — M546 Pain in thoracic spine: Secondary | ICD-10-CM | POA: Insufficient documentation

## 2012-04-21 DIAGNOSIS — M549 Dorsalgia, unspecified: Secondary | ICD-10-CM

## 2012-04-21 DIAGNOSIS — C8590 Non-Hodgkin lymphoma, unspecified, unspecified site: Secondary | ICD-10-CM

## 2012-04-21 MED ORDER — GADOBENATE DIMEGLUMINE 529 MG/ML IV SOLN
13.0000 mL | Freq: Once | INTRAVENOUS | Status: AC | PRN
  Administered 2012-04-21: 13 mL via INTRAVENOUS

## 2012-05-19 ENCOUNTER — Other Ambulatory Visit: Payer: Self-pay

## 2012-05-19 DIAGNOSIS — C8590 Non-Hodgkin lymphoma, unspecified, unspecified site: Secondary | ICD-10-CM

## 2012-05-19 MED ORDER — LEVOTHYROXINE SODIUM 100 MCG PO TABS: 100.0000 ug | ORAL_TABLET | Freq: Every day | ORAL | Status: DC

## 2012-06-06 ENCOUNTER — Encounter: Payer: Self-pay | Admitting: Nurse Practitioner

## 2012-06-06 ENCOUNTER — Ambulatory Visit (INDEPENDENT_AMBULATORY_CARE_PROVIDER_SITE_OTHER): Payer: 59 | Admitting: Nurse Practitioner

## 2012-06-06 VITALS — BP 110/68 | HR 70 | Ht 64.0 in | Wt 144.6 lb

## 2012-06-06 DIAGNOSIS — M546 Pain in thoracic spine: Secondary | ICD-10-CM

## 2012-06-06 DIAGNOSIS — M549 Dorsalgia, unspecified: Secondary | ICD-10-CM | POA: Insufficient documentation

## 2012-06-06 DIAGNOSIS — M792 Neuralgia and neuritis, unspecified: Secondary | ICD-10-CM | POA: Insufficient documentation

## 2012-06-06 DIAGNOSIS — IMO0002 Reserved for concepts with insufficient information to code with codable children: Secondary | ICD-10-CM

## 2012-06-06 MED ORDER — VALACYCLOVIR HCL 1 G PO TABS: 1000.0000 mg | ORAL_TABLET | Freq: Three times a day (TID) | ORAL | Status: DC

## 2012-06-06 NOTE — Assessment & Plan Note (Signed)
Possible early pain from herpes zoster. Given prescription for Valtrex. Start immediately if any rash is noted in the affected area. Also given patient instruction sheet. Ibuprofen and topical capsaicin cream as directed for pain. Defers narcotic pain reliever. Call back if worsens or persists.

## 2012-06-06 NOTE — Patient Instructions (Addendum)
Shingles Shingles is caused by the same virus that causes chickenpox (varicella zoster virus or VZV). Shingles often occurs many years or decades after having chickenpox. That is why it is more common in adults older than 50 years. The virus reactivates and breaks out as an infection in a nerve root. SYMPTOMS   The initial feeling (sensations) may be pain. This pain is usually described as:  Burning.  Stabbing.  Throbbing.  Tingling in the nerve root.  A red rash will follow in a couple days. The rash may occur in any area of the body and is usually on one side (unilateral) of the body in a band or belt-like pattern. The rash usually starts out as very small blisters (vesicles). They will dry up after 7 to 10 days. This is not usually a significant problem except for the pain it causes.  Long-lasting (chronic) pain is more likely in an elderly person. It can last months to years. This condition is called postherpetic neuralgia. Shingles can be an extremely severe infection in someone with AIDS, a weakened immune system, or with forms of leukemia. It can also be severe if you are taking transplant medicines or other medicines that weaken the immune system. TREATMENT  Your caregiver will often treat you with:  Antiviral drugs.  Anti-inflammatory drugs.  Pain medicines. Bed rest is very important in preventing the pain associated with herpes zoster (postherpetic neuralgia). Application of heat in the form of a hot water bottle or electric heating pad or gentle pressure with the hand is recommended to help with the pain or discomfort. PREVENTION  A varicella zoster vaccine is available to help protect against the virus. The Food and Drug Administration approved the varicella zoster vaccine for individuals 71 years of age and older. HOME CARE INSTRUCTIONS   Cool compresses to the area of rash may be helpful.  Only take over-the-counter or prescription medicines for pain, discomfort, or  fever as directed by your caregiver.  Avoid contact with:  Babies.  Pregnant women.  Children with eczema.  Elderly people with transplants.  People with chronic illnesses, such as leukemia and AIDS.  If the area involved is on your face, you may receive a referral for follow-up to a specialist. It is very important to keep all follow-up appointments. This will help avoid eye complications, chronic pain, or disability. SEEK IMMEDIATE MEDICAL CARE IF:   You develop any pain (headache) in the area of the face or eye. This must be followed carefully by your caregiver or ophthalmologist. An infection in part of your eye (cornea) can be very serious. It could lead to blindness.  You do not have pain relief from prescribed medicines.  Your redness or swelling spreads.  The area involved becomes very swollen and painful.  You have a fever.  You notice any red or painful lines extending away from the affected area toward your heart (lymphangitis).  Your condition is worsening or has changed. Document Released: 02/16/2005 Document Revised: 05/11/2011 Document Reviewed: 01/21/2009 [facility] Patient Information 2013 [place].   Try capsaicin cream as directed.

## 2012-06-06 NOTE — Assessment & Plan Note (Addendum)
See notes on neuropathic pain.  Call back if pain persists.  Also contacted [facility] to see if they had a back brace for thoracic area per patient request to see if this would help chronic pain. Patient contacted with this information.

## 2012-06-06 NOTE — Progress Notes (Signed)
Subjective:  Presents for complaints of pain in the right upper back area localized that began yesterday. Pain occurs off and on. No fever or cough. Saw some bumps in the area last night but none today. Has a history of chronic thoracic back pain since she was treated with chemotherapy for lymphoma. Had a recent MRI. Requesting some form of back brace to help her chronic back pain. Has chronic malaise, no change in fatigue level. History of off-and-on tingling in this area but no significant pain.  Objective:   BP 110/68  Pulse 70  Ht 5' 4"  (1.626 m)  Wt 144 lb 9.6 oz (65.59 kg)  BMI 24.81 kg/m2 NAD. Alert, oriented. Lungs clear. Heart regular rate rhythm. Localized area of tenderness noted in the upper right thoracic area towards the posterior axillary line. No rash is noted.

## 2012-07-02 ENCOUNTER — Encounter: Payer: Self-pay | Admitting: *Deleted

## 2012-07-05 ENCOUNTER — Ambulatory Visit: Payer: 59 | Admitting: Family Medicine

## 2012-07-06 ENCOUNTER — Ambulatory Visit (INDEPENDENT_AMBULATORY_CARE_PROVIDER_SITE_OTHER): Payer: 59 | Admitting: Family Medicine

## 2012-07-06 ENCOUNTER — Encounter: Payer: Self-pay | Admitting: Family Medicine

## 2012-07-06 VITALS — BP 102/77 | Temp 99.0°F | Wt 139.2 lb

## 2012-07-06 DIAGNOSIS — E039 Hypothyroidism, unspecified: Secondary | ICD-10-CM

## 2012-07-06 DIAGNOSIS — R109 Unspecified abdominal pain: Secondary | ICD-10-CM

## 2012-07-06 DIAGNOSIS — M549 Dorsalgia, unspecified: Secondary | ICD-10-CM

## 2012-07-06 LAB — POCT URINALYSIS DIPSTICK
Spec Grav, UA: <=1.005
pH, UA: 8

## 2012-07-06 NOTE — Progress Notes (Signed)
  Subjective:    Patient ID: [name], female    DOB: 12-21-1965, 47 y.o.   MRN: [id number]  Flank Pain This is a new problem. The current episode started in the past 7 days. The problem occurs intermittently. The problem has been gradually worsening since onset. The pain is at a severity of 8/10. The pain is moderate. The pain is worse during the day. She has tried NSAIDs for the symptoms. The treatment provided no relief.   Patient with intermittent abdominal discomfort in the left upper quadrant she described as left flank but is truly the left upper quadrant of the abdomen. There is no injury to this area. No vomiting diarrhea sweats chills or fever. Energy [name] overall decent. She has had previous trouble with back pain she even had MRI he showed an area that could have been a sign of some sort of bone marrow issue and recommended a repeat again in several months time if she was having ongoing trouble.   Review of Systems  Genitourinary: Positive for flank pain.  no cough wheezing difficulty breathing no unintended weight loss. Weight come down a bit but she is eating healthier. She does have a history of thyroid and is on medication.     Objective:   Physical Exam  Neck no masses lungs are clear heart is regular pulse normal abdomen soft there is no guarding no rebound. No masses are felt.extremities no edema skin warm dry      Assessment & Plan:  Hypothyroidism-continue medication check lab work. Weight loss I think this is probably related to eating healthier but await test results Discomfort left upper quadrant I recommend ultrasound. Hold off on doing a CAT scan because of radiation. If ultrasound negative consider referral to gastroenterology to look into this further

## 2012-07-06 NOTE — Progress Notes (Signed)
U/S of abdomen scheduled for Jul 11, 2012 at 10:45, pt aware

## 2012-07-07 ENCOUNTER — Other Ambulatory Visit: Payer: Self-pay | Admitting: Obstetrics and Gynecology

## 2012-07-08 ENCOUNTER — Encounter (HOSPITAL_COMMUNITY): Payer: 59 | Attending: Oncology | Admitting: Oncology

## 2012-07-08 ENCOUNTER — Ambulatory Visit (HOSPITAL_COMMUNITY)
Admission: RE | Admit: 2012-07-08 | Discharge: 2012-07-08 | Disposition: A | Discharge status: Home / Self Care | Payer: 59 | Source: Ambulatory Visit | Attending: Family Medicine | Admitting: Family Medicine

## 2012-07-08 ENCOUNTER — Encounter (HOSPITAL_COMMUNITY): Payer: Self-pay | Admitting: Oncology

## 2012-07-08 VITALS — BP 88/56 | HR 78 | Temp 98.2°F | Resp 16 | Wt 135.2 lb

## 2012-07-08 DIAGNOSIS — R1012 Left upper quadrant pain: Secondary | ICD-10-CM

## 2012-07-08 DIAGNOSIS — R109 Unspecified abdominal pain: Secondary | ICD-10-CM | POA: Insufficient documentation

## 2012-07-08 DIAGNOSIS — Z8572 Personal history of non-Hodgkin lymphomas: Secondary | ICD-10-CM | POA: Insufficient documentation

## 2012-07-08 DIAGNOSIS — C8590 Non-Hodgkin lymphoma, unspecified, unspecified site: Secondary | ICD-10-CM

## 2012-07-08 DIAGNOSIS — R634 Abnormal weight loss: Secondary | ICD-10-CM

## 2012-07-08 DIAGNOSIS — C8589 Other specified types of non-Hodgkin lymphoma, extranodal and solid organ sites: Secondary | ICD-10-CM | POA: Insufficient documentation

## 2012-07-08 HISTORY — DX: Non-Hodgkin lymphoma, unspecified, unspecified site: C85.90

## 2012-07-08 LAB — CBC WITH DIFFERENTIAL/PLATELET
Basophils Absolute: 0.1 10*3/uL (ref 0.0–0.1)
Eosinophils Relative: 3 % (ref 0–5)
HCT: 38.6 % (ref 36.0–46.0)
Hemoglobin: 13 g/dL (ref 12.0–15.0)
Lymphocytes Relative: 29 % (ref 12–46)
MCV: 91 fL (ref 78.0–100.0)
Monocytes Absolute: 1.2 10*3/uL — ABNORMAL HIGH (ref 0.1–1.0)
Monocytes Relative: 18 % — ABNORMAL HIGH (ref 3–12)
Neutro Abs: 3.3 10*3/uL (ref 1.7–7.7)
RDW: 13.7 % (ref 11.5–15.5)
WBC: 6.7 10*3/uL (ref 4.0–10.5)

## 2012-07-08 LAB — COMPREHENSIVE METABOLIC PANEL
BUN: 13 mg/dL (ref 6–23)
CO2: 27 mEq/L (ref 19–32)
Calcium: 9.1 mg/dL (ref 8.4–10.5)
Chloride: 102 mEq/L (ref 96–112)
Creatinine, Ser: 0.67 mg/dL (ref 0.50–1.10)
GFR calc Af Amer: >90 mL/min (ref >90)
GFR calc non Af Amer: >90 mL/min (ref >90)
Glucose, Bld: 89 mg/dL (ref 70–99)
Total Bilirubin: 0.3 mg/dL (ref 0.3–1.2)

## 2012-07-08 LAB — SEDIMENTATION RATE: Sed Rate: 3 mm/hr (ref 0–22)

## 2012-07-08 LAB — LACTATE DEHYDROGENASE: LDH: 180 U/L (ref 94–250)

## 2012-07-08 NOTE — Patient Instructions (Addendum)
[name] Discharge Instructions  RECOMMENDATIONS MADE BY THE CONSULTANT AND ANY TEST RESULTS WILL BE SENT TO YOUR REFERRING PHYSICIAN.  EXAM FINDINGS BY THE PHYSICIAN TODAY AND SIGNS OR SYMPTOMS TO REPORT TO CLINIC OR PRIMARY PHYSICIAN: Exam and discussion by PA.  Will try to get ultrasound and blood work  done today and will also get you scheduled for PET Scan.  MEDICATIONS PRESCRIBED:  none  INSTRUCTIONS GIVEN AND DISCUSSED: As we get results we will let you know. PET scan to be done at [facility] on 5/19 and you need to arrive at 8:45am.  Nothing to eat or drink 6 hours prior, do not eat or drink anything with sugar in it - no chewing gum, mints, hard candy etc.  SPECIAL INSTRUCTIONS/FOLLOW-UP: Follow-up in 3 weeks.  Thank you for choosing [facility] to provide your oncology and hematology care.  To afford each patient quality time with our providers, please arrive at least 15 minutes before your scheduled appointment time.  With your help, our goal is to use those 15 minutes to complete the necessary work-up to ensure our physicians have the information they need to help with your evaluation and healthcare recommendations.    Effective January 1st, 2014, we ask that you re-schedule your appointment with our physicians should you arrive 10 or more minutes late for your appointment.  We strive to give you quality time with our providers, and arriving late affects you and other patients whose appointments are after yours.    Again, thank you for choosing [facility].  Our hope is that these requests will decrease the amount of time that you wait before being seen by our physicians.       _____________________________________________________________  Should you have questions after your visit to [facility], please contact our office at [phone] between the hours of 8:30 a.m. and 5:00 p.m.  Voicemails left after  4:30 p.m. will not be returned until the following business day.  For prescription refill requests, have your pharmacy contact our office with your prescription refill request.

## 2012-07-08 NOTE — Progress Notes (Signed)
Susan Lange, MD [place] [address]  NHL (non-Hodgkin's lymphoma)  CURRENT THERAPY: Surveillance  INTERVAL HISTORY: [name] 47 y.o. 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 Nila Nephew and a mini mantle setup presenting here for the first time on 05/21/2000, finishing chemotherapy as of 09/22/2000, then radiation.    [name] is down 20 lbs since May 2013.  She reports that her appetite is decreased and she rates it at 50%.  She reports that she will make a large meal and then only consume a few bites and then she is full. She admits to early satiety.  She also notes left upper quadrant abdominal discomfort.  She reports that it is deep to her ribs and she points directly to her splenic area of abdomen.  She admits to unintentional weight loss. Her weight has decreased significantly over the past year. She reports a weight loss as always been a difficult thing for her but now she is losing weight without trying.  She denies any fevers, chills, or night sweats.  She was seen by primary care physician recently who ordered a left upper quadrant abdominal ultrasound. This is scheduled for Monday. At the patient's request, I have gotten this rescheduled for this afternoon at 3:30 PM.  We'll perform laboratory work today as described below. I will also get her scheduled for a PET scan. This can always be canceled if we need to in the future.  She reports overall she feels "different." She reports that her fibromyalgia diagnosis and physical manifestations of this disease are of course bothersome for her, but more recently, she felt even worse.  Chart is reviewed. Labs in the past I reviewed. Radiographic studies are reviewed.  She did have an MRI of her thoracic spine on 04/22/2012. It is noted that she has mild heterogenicity of bone marrow  and this may reflect sequelae of chemotherapy. Otherwise negative thoracic MRI.  Past Medical History  Diagnosis Date  . Cancer     large b-cell lymphoma  . Iron deficiency anemia   . Depression   . Thyroid disease     hypothyroidism  . Fibromyalgia   . Lymphoma   . Low HDL (under 40)   . Fibromyalgia   . 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 Nila Nephew and a mini mantle setup presenting here for the first time on 05/21/2000, finishing chemotherapy as of 09/22/2000, then radiation.      has Upper back pain on right side; Neuropathic pain; Unspecified hypothyroidism; and NHL (non-Hodgkin's lymphoma) on her problem list.     is allergic to avelox and codeine.  [name] does not currently have medications on file.  Past Surgical History  Procedure Laterality Date  . Back surgery      artificial disc in lumbar area  . Incontinence surgery    . Colonoscopy  April 2005    normal  . Breast biopsy      X 2 both benigh    Denies any headaches, dizziness, double vision, fevers, chills, night sweats, nausea, vomiting, diarrhea, constipation, chest pain, heart palpitations, shortness of breath, blood in stool, black tarry stool, urinary pain, urinary burning, urinary frequency, hematuria.   PHYSICAL EXAMINATION  ECOG PERFORMANCE STATUS: 1 - Symptomatic but  completely ambulatory  Filed Vitals:   07/08/12 1100  BP: 88/56  Pulse: 78  Temp: 98.2 F (36.8 C)  Resp: 16    GENERAL:alert, no distress, well nourished, well developed, comfortable, cooperative and smiling SKIN: skin color, texture, turgor are normal, no rashes or significant lesions HEAD: Normocephalic, No masses, lesions, tenderness or abnormalities EYES: normal, PERRLA, EOMI, Conjunctiva are pink and non-injected EARS: External ears normal OROPHARYNX:lips, buccal mucosa, and tongue normal and  mucous membranes are moist  NECK: supple, no adenopathy, thyroid normal size, non-tender, without nodularity, no stridor, non-tender, trachea midline LYMPH:  no palpable lymphadenopathy BREAST:breasts appear normal, no suspicious masses, no skin or nipple changes or axillary nodes LUNGS: clear to auscultation and percussion HEART: regular rate & rhythm, no murmurs, no gallops, S1 normal and S2 normal ABDOMEN:abdomen soft, non-tender, normal bowel sounds and no hepatosplenomegaly noted, but her abdominal muscles prevent easy examination BACK: Back symmetric, no curvature., No CVA tenderness EXTREMITIES:less then 2 second capillary refill, no joint deformities, effusion, or inflammation, no edema, no skin discoloration, no clubbing, no cyanosis  NEURO: alert & oriented x 3 with fluent speech, no focal motor/sensory deficits, gait normal    LABORATORY DATA: CBC    Component Value Date/Time   WBC 7.5 07/10/2011 1309   RBC 4.01 07/10/2011 1309   HGB 12.2 07/10/2011 1309   HCT 37.0 07/10/2011 1309   PLT 405* 07/10/2011 1309   MCV 92.3 07/10/2011 1309   MCH 30.4 07/10/2011 1309   MCHC 33.0 07/10/2011 1309   RDW 13.4 07/10/2011 1309   LYMPHSABS 2.0 07/10/2011 1309   MONOABS 1.1* 07/10/2011 1309   EOSABS 0.2 07/10/2011 1309   BASOSABS 0.0 07/10/2011 1309      Chemistry      Component Value Date/Time   NA 136 07/10/2011 1309   K 4.1 07/10/2011 1309   CL 103 07/10/2011 1309   CO2 23 07/10/2011 1309   BUN 10 07/10/2011 1309   CREATININE 0.54 07/10/2011 1309      Component Value Date/Time   CALCIUM 9.4 07/10/2011 1309   ALKPHOS 73 07/10/2011 1309   AST 32 07/10/2011 1309   ALT 35 07/10/2011 1309   BILITOT 0.3 07/10/2011 1309     Lab Results  Component Value Date   FOLATE 8.6 07/10/2011   Results for [name], [name] (MRN [id number]) as of 07/08/2012 11:29  Ref. Range 07/10/2011 13:09  LDH Latest Range: 94-250 U/L 214   Results for [name], [name] (MRN [id number]) as of 07/08/2012 11:29  Ref. Range  07/10/2011 13:09  Vitamin B-12 Latest Range: 211-911 pg/mL 268   Results for [name], [name] (MRN [id number]) as of 07/08/2012 11:29  Ref. Range 07/10/2011 13:09  TSH Latest Range: 0.350-4.500 uIU/mL 2.282      RADIOGRAPHIC STUDIES:  04/22/2012  *RADIOLOGY REPORT*  Clinical Data: 47 year old female with 2002 history of lymphoma  status post chemotherapy and radiation. New onset thoracic spine  pain. Weakness numbness bilateral upper extremities.  MRI THORACIC SPINE WITHOUT AND WITH CONTRAST  Technique: Multiplanar and multiecho pulse sequences of the  thoracic spine were obtained without and with intravenous contrast.  Contrast: 63m MULTIHANCE GADOBENATE DIMEGLUMINE 529 MG/ML IV SOLN  Comparison: Chest CT 02/11/2010. PET-CT 01/09/2009.  Findings: Bone marrow signal is mildly heterogeneous throughout the  visualized vertebral bodies except at the T2 level which  demonstrate homogeneously increased T1 and T2 signal. This level  also demonstrated mildly different bone mineralization density in  2011. There is no marrow edema or  abnormal marrow enhancement in  the lower cervical spine, thoracic spine, or visualized L1 level.  No posterior rib lesion is identified.  Visualized paraspinal soft tissues are within normal limits.  There is chronic increased signal in the left lung apex stable  since 2011 and thought related to scarring on that study. Other  visualized thoracic and upper abdominal viscera are within normal  limits.  Capacious spinal canal. Spinal cord signal is within normal limits  at all visualized levels. No abnormal intradural enhancement.  Thoracic intervertebral discs are within normal limits. No disc  herniation identified. No spinal stenosis, foraminal stenosis, or  neural impingement identified.  IMPRESSION:  1. Bone marrow signal is mildly heterogeneous, but there is no  discrete or suspicious marrow lesion. I suspect this may reflect  sequelae of chemotherapy.  If spine pain continues and remains  unexplained, a followup study in 3-6 months to confirm stability of  the marrow would be reassuring.  2. Otherwise negative thoracic spine MRI.  3. Chronic opacity/scarring left posterior lung apex appears  stable since 2011.  Original Report Authenticated By: Roselyn Reef, M.D.     ASSESSMENT:  1. 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 Nila Nephew and a mini mantle setup presenting here for the first time on 05/21/2000, finishing chemotherapy as of 09/22/2000, then radiation.  2. Memory issues and she is following up with Dr. Floyde Parkins.  3. Iron deficiency in the past.  4. Postpartum depression in the past.  5. Fibromyalgia followed by Dr. Amil Amen in [place].  6. Codeine intolerance.  7. Benign lump in her breast status post resection.  8. Hypothyroidism status post radiation and she is on replacement Synthroid now 100 mcg a day, and her TSH has not been checked in 3 months she thinks so we will check that.  9. Two uneventful pregnancies, labor and deliveries with 2 sons both in good health. One is 55 and one is in his early 39s.  10. Intermittent left hip discomfort in the past. 11. Weight loss of 20 lbs 12. LUQ abdominal pain   PLAN:  1. Chart reviewed 2. I personally reviewed and went over radiographic studies with the patient. 3. I personally reviewed and went over laboratory results with the patient. 4. Labs today: CBC diff, CMET, LDH, ESR, TSH, Folate, Vitamin B12 5. [place] of abdomen rescheduled for this afternoon. 6. PET scan within the next 2 weeks. 7. Return in 3 weeks for follow-up. If work-up is negative, will refer patient to Gi for consideration of EGD.  All questions were answered. The patient knows to call the clinic with any problems, questions or concerns. We can certainly see the patient much sooner if necessary.  Patient and plan will be  discussed with Dr. Tressie Stalker within the next 24 hours.   I spent 25 minutes counseling the patient face to face. The total time spent in the appointment was 40 minutes.  [name]

## 2012-07-09 ENCOUNTER — Encounter (HOSPITAL_COMMUNITY): Payer: Self-pay | Admitting: Oncology

## 2012-07-09 ENCOUNTER — Other Ambulatory Visit (HOSPITAL_COMMUNITY): Payer: Self-pay | Admitting: Oncology

## 2012-07-09 DIAGNOSIS — E538 Deficiency of other specified B group vitamins: Secondary | ICD-10-CM

## 2012-07-09 HISTORY — DX: Deficiency of other specified B group vitamins: E53.8

## 2012-07-09 LAB — TSH: TSH: 1.893 u[IU]/mL (ref 0.350–4.500)

## 2012-07-09 MED ORDER — FOLIC ACID 1 MG PO TABS: ORAL_TABLET | ORAL | Status: DC

## 2012-07-11 ENCOUNTER — Telehealth (HOSPITAL_COMMUNITY): Payer: Self-pay | Admitting: Dietician

## 2012-07-11 ENCOUNTER — Telehealth (HOSPITAL_COMMUNITY): Payer: Self-pay

## 2012-07-11 ENCOUNTER — Ambulatory Visit (HOSPITAL_COMMUNITY): Payer: 59

## 2012-07-11 NOTE — Telephone Encounter (Signed)
Message copied by Mellissa Kohut on Mon Jul 11, 2012  9:39 AM ------      Message from: [facility]      Created: Mon Jul 11, 2012  9:18 AM       Cancel PET, no enlarged spleen on [place].  Call patient.  We agree with Dr. Wolfgang Phoenix that she needs to see GI. Cancel return appointment here and have her come back in mid to late-July to see PA when Neijstrom is here. ------

## 2012-07-11 NOTE — Telephone Encounter (Signed)
Nutrition Note  Pt identified for weight loss on [facility] Nutrition Screen.   Wt Readings from Last 10 Encounters:  07/08/12 135 lb 3.2 oz (61.326 kg)  07/06/12 139 lb 3.2 oz (63.141 kg)  06/06/12 144 lb 9.6 oz (65.59 kg)  07/10/11 156 lb 3.2 oz (70.852 kg)   Chart reviewed. Wt hx reveals a 20# (12.8%) wt loss x 1 year and a 9# (6.3%) wt loss x 1 month. 1 month wt loss is clinically significant. MD notes reveal decreased appetite and early satiety.  Called and spoke with pt at 1642. She reports that she has been trying to lose weight for 12 years, but has now been successful over the past 3 months. She is happy about the weight loss. Her summer clothes from 1 year ago are loose. She does tell me that her appetite is not as good, particularly at dinner time; she may only eat 50% of her dinner. Lunch and Breakfast tend to be better meals. She reports that she has made many attempts at weight loss over there years, including nutritional shakes and Weight Watchers, but never followed a particular plan long term. She denies any changes in physical activity. She reports her fibromyalgia prevents her from being as physically active as she would like.  She admits to increased fruit and vegetable consumption, particularly oranges. She reports that she has noticed a decreased change in appetite about 3 months ago, when she was prescribed adderall for her fibromyalgia. Noted referral to GI.  Educated pt on maximizing intake when appetite is poor. Discussed ways to increase calorie, including smaller, more frequent meals and taking advantage of times when appetite is good. Teach Back method used. Questions answered. Pt grateful for information. Will continue to follow.   Joaquim Lai, RD, LDN Pager: [phone]

## 2012-07-11 NOTE — Telephone Encounter (Signed)
Patient notified, PET scan cancelled and follow-up rescheduled for July 16.

## 2012-07-12 ENCOUNTER — Telehealth: Payer: Self-pay | Admitting: Family Medicine

## 2012-07-12 ENCOUNTER — Telehealth: Payer: Self-pay | Admitting: Internal Medicine

## 2012-07-12 NOTE — Telephone Encounter (Signed)
Pt called, states that U/S was normal and that Dr. Tressie Stalker agreed with Dr. Nicki Reaper about pt needing GI referral.  She has seen Dr. Carlean Purl at [facility] in the past.  She called their office, was told 1st available is 08/08/12, they took down her symptoms and said that a nurse would have to call her back.  That message was taken about 11:30 this morning.  Please advise.  Pt should not need new referral.

## 2012-07-12 NOTE — Telephone Encounter (Signed)
I believe this is the right thing for her to do. She can call GI for appointment. If they do not give her appointment she should notify us and we can initiate referral.

## 2012-07-12 NOTE — Progress Notes (Signed)
Coming back in July

## 2012-07-12 NOTE — Telephone Encounter (Signed)
Patient is scheduled for 07/13/12 10:45 with Alonza Bogus, PA

## 2012-07-13 ENCOUNTER — Ambulatory Visit (INDEPENDENT_AMBULATORY_CARE_PROVIDER_SITE_OTHER): Payer: 59 | Admitting: Gastroenterology

## 2012-07-13 ENCOUNTER — Encounter: Payer: Self-pay | Admitting: Gastroenterology

## 2012-07-13 ENCOUNTER — Encounter: Payer: Self-pay | Admitting: Internal Medicine

## 2012-07-13 VITALS — BP 118/70 | HR 87 | Ht 64.0 in | Wt 136.0 lb

## 2012-07-13 DIAGNOSIS — R634 Abnormal weight loss: Secondary | ICD-10-CM

## 2012-07-13 DIAGNOSIS — R1012 Left upper quadrant pain: Secondary | ICD-10-CM

## 2012-07-13 DIAGNOSIS — Z8579 Personal history of other malignant neoplasms of lymphoid, hematopoietic and related tissues: Secondary | ICD-10-CM

## 2012-07-13 DIAGNOSIS — Z87898 Personal history of other specified conditions: Secondary | ICD-10-CM

## 2012-07-13 MED ORDER — MOVIPREP 100 G PO SOLR: 1.0000 | Freq: Once | ORAL | Status: DC

## 2012-07-13 NOTE — Progress Notes (Signed)
07/13/2012 [name] [id number] Jan 18, 1966   HISTORY OF PRESENT ILLNESS:  Patient is a pleasant 47 year old female who is a patient of Dr. Celesta Aver.  She has a history of non-Hodgkin's lymphoma in 2002 with chemo.  She has been referred to our office on this occasion for complaints of LUQ that has been present for about 3 months.  Says that she feels it every day, but it is not always present throughout the entire day (sometimes it is and sometimes it is not).  It does not get worse with eating or movement and cannot be reproduced with palpation.  Weight loss of 22 pounds in one year (somewhat intentional), but 10 pounds within the past month without trying much.  No nausea, vomiting, dark or bloody stools.  No diarrhea or constipation but does have some urgency with her BM's at times.  She had a colonoscopy in 2005 that was completely normal, but it was recommended by Dr. Carlean Purl that she have another colonoscopy in 3 years from that time due to her history of lymphoma and extensive family history of various cancers.  She never had repeat colonoscopy, however.    Past Medical History  Diagnosis Date  . Cancer     large b-cell lymphoma  . Iron deficiency anemia   . Depression   . Thyroid disease     hypothyroidism  . Fibromyalgia   . Lymphoma   . Low HDL (under 40)   . Fibromyalgia   . 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 Nila Nephew and a mini mantle setup presenting here for the first time on 05/21/2000, finishing chemotherapy as of 09/22/2000, then radiation.    . Folate deficiency 07/09/2012    Started on Folic Acid on 9/48/5462   Past Surgical History  Procedure Laterality Date  . Back surgery      artificial disc in lumbar area  . Incontinence surgery    . Colonoscopy  April 2005    normal  . Breast biopsy      X 2 both benigh    reports that she has  never smoked. She has never used smokeless tobacco. She reports that she does not drink alcohol or use illicit drugs. family history includes Breast cancer in her mother; Cancer in her maternal aunt, maternal grandmother, and mother; Colon cancer (age of onset: 90) in her maternal grandmother; Liver cancer in her maternal grandfather; and Uterine cancer in her mother. Allergies  Allergen Reactions  . Avelox (Moxifloxacin Hcl In Nacl) Nausea Only  . Codeine Nausea And Vomiting      Outpatient Encounter Prescriptions as of 07/13/2012  Medication Sig Dispense Refill  . ALPRAZolam (XANAX) 0.5 MG tablet Take 0.5 mg by mouth at bedtime as needed.      . beta carotene w/minerals (OCUVITE) tablet Take 1 tablet by mouth daily.      . calcium carbonate (OS-CAL) 600 MG TABS Take 600 mg by mouth daily.      . cholecalciferol (VITAMIN D) 1000 UNITS tablet Take 1,200 Units by mouth daily.      . citalopram (CELEXA) 20 MG tablet       . diphenhydramine-acetaminophen (TYLENOL PM) 25-500 MG TABS Take 1 tablet by mouth at bedtime as needed.      . ferrous sulfate 325 (65 FE) MG tablet Take 325 mg by mouth daily with breakfast.      .  folic acid (FOLVITE) 1 MG tablet Take 1 tablet twice daily x 14 days and then daily thereafter  45 tablet  1  . ibuprofen (ADVIL,MOTRIN) 400 MG tablet Take 400 mg by mouth every 6 (six) hours as needed.      Marland Kitchen levothyroxine (SYNTHROID, LEVOTHROID) 100 MCG tablet Take 1 tablet (100 mcg total) by mouth daily.  30 tablet  5  . Melatonin 3 MG TABS Take 6 mg by mouth at bedtime.       No facility-administered encounter medications on file as of 07/13/2012.     REVIEW OF SYSTEMS  : All other systems reviewed and negative except where noted in the History of Present Illness.   PHYSICAL EXAM: BP 118/70  Pulse 87  Ht 5' 4"  (1.626 m)  Wt 136 lb (61.689 kg)  BMI 23.33 kg/m2  SpO2 98%  LMP 06/22/2012 General: Well developed white female in no acute distress Head: Normocephalic  and atraumatic Eyes:  sclerae anicteric,conjunctive pink. Ears: Normal auditory acuity Lungs: Clear throughout to auscultation Heart: Regular rate and rhythm Abdomen: Soft, nontender, non-distended. No masses or hepatomegaly noted. Normal bowel sounds. Rectal: Deferred.  Will be performed at the time of colonoscopy. Musculoskeletal: Symmetrical with no gross deformities  Skin: No lesions on visible extremities Extremities: No edema  Neurological: Alert oriented x 4, grossly nonfocal Psychological:  Alert and cooperative. Normal mood and affect  ASSESSMENT AND PLAN: -Weight loss -LUQ abdominal pain -History of lymphoma  *Will schedule EGD and colonoscopy for evaluation.

## 2012-07-13 NOTE — Patient Instructions (Addendum)
You have been scheduled for an endoscopy and colonoscopy with propofol. Please follow the written instructions given to you at your visit today. Please pick up your prep at the pharmacy within the next 1-3 days. If you use inhalers (even only as needed), please bring them with you on the day of your procedure. Your physician has requested that you go to www.startemmi.com and enter the access code given to you at your visit today. This web site gives a general overview about your procedure. However, you should still follow specific instructions given to you by our office regarding your preparation for the procedure. CC:  [name] MD

## 2012-07-14 NOTE — Progress Notes (Signed)
Agree w/ management per Ms. Myrtice Lauth

## 2012-07-18 ENCOUNTER — Other Ambulatory Visit (HOSPITAL_COMMUNITY): Payer: 59

## 2012-07-19 ENCOUNTER — Telehealth: Payer: Self-pay | Admitting: Family Medicine

## 2012-07-19 NOTE — Telephone Encounter (Signed)
Discussed ultrasound results with patient

## 2012-07-19 NOTE — Telephone Encounter (Signed)
Call pt back when you can, you had called for her to call back an she held for quite some time and request you call back when you can

## 2012-07-21 ENCOUNTER — Encounter: Payer: Self-pay | Admitting: Internal Medicine

## 2012-07-21 ENCOUNTER — Ambulatory Visit (AMBULATORY_SURGERY_CENTER): Payer: 59 | Admitting: Internal Medicine

## 2012-07-21 VITALS — BP 97/61 | HR 76 | Temp 98.0°F | Resp 12 | Ht 64.0 in | Wt 136.0 lb

## 2012-07-21 DIAGNOSIS — R634 Abnormal weight loss: Secondary | ICD-10-CM

## 2012-07-21 DIAGNOSIS — K319 Disease of stomach and duodenum, unspecified: Secondary | ICD-10-CM

## 2012-07-21 DIAGNOSIS — Z87898 Personal history of other specified conditions: Secondary | ICD-10-CM

## 2012-07-21 DIAGNOSIS — R1012 Left upper quadrant pain: Secondary | ICD-10-CM

## 2012-07-21 MED ORDER — SODIUM CHLORIDE 0.9 % IV SOLN: 500.0000 mL | INTRAVENOUS | Status: DC

## 2012-07-21 NOTE — Patient Instructions (Addendum)
I took biopsies of the duodenum (small intestine) to see if you have celiac disease. There were some possible changes in the lining of the bowel that suggested that but need the pathology to see.  The rest of the upper endoscopy was ok.  The colonoscopy was normal.  Next colonoscopy (routine) should be considered in 5 years - 2019.  I appreciate the opportunity to care for you.  Gatha Mayer, MD, FACG  YOU HAD AN ENDOSCOPIC PROCEDURE TODAY AT [facility]: Refer to the procedure report that was given to you for any specific questions about what was found during the examination.  If the procedure report does not answer your questions, please call your gastroenterologist to clarify.  If you requested that your care partner not be given the details of your procedure findings, then the procedure report has been included in a sealed envelope for you to review at your convenience later.  YOU SHOULD EXPECT: Some feelings of bloating in the abdomen. Passage of more gas than usual.  Walking can help get rid of the air that was put into your GI tract during the procedure and reduce the bloating. If you had a lower endoscopy (such as a colonoscopy or flexible sigmoidoscopy) you may notice spotting of blood in your stool or on the toilet paper. If you underwent a bowel prep for your procedure, then you may not have a normal bowel movement for a few days.  DIET: Your first meal following the procedure should be a light meal and then it is ok to progress to your normal diet.  A half-sandwich or bowl of soup is an example of a good first meal.  Heavy or fried foods are harder to digest and may make you feel nauseous or bloated.  Likewise meals heavy in dairy and vegetables can cause extra gas to form and this can also increase the bloating.  Drink plenty of fluids but you should avoid alcoholic beverages for 24 hours.  ACTIVITY: Your care partner should take you home directly after the  procedure.  You should plan to take it easy, moving slowly for the rest of the day.  You can resume normal activity the day after the procedure however you should NOT DRIVE or use heavy machinery for 24 hours (because of the sedation medicines used during the test).    SYMPTOMS TO REPORT IMMEDIATELY: A gastroenterologist can be reached at any hour.  During normal business hours, 8:30 AM to 5:00 PM Monday through Friday, call [phone].  After hours and on weekends, please call the GI answering service at [phone] who will take a message and have the physician on call contact you.   Following lower endoscopy (colonoscopy or flexible sigmoidoscopy):  Excessive amounts of blood in the stool  Significant tenderness or worsening of abdominal pains  Swelling of the abdomen that is new, acute  Fever of 100F or higher  Following upper endoscopy (EGD)  Vomiting of blood or coffee ground material  New chest pain or pain under the shoulder blades  Painful or persistently difficult swallowing  New shortness of breath  Fever of 100F or higher  Black, tarry-looking stools  FOLLOW UP: If any biopsies were taken you will be contacted by phone or by letter within the next 1-3 weeks.  Call your gastroenterologist if you have not heard about the biopsies in 3 weeks.  Our staff will call the home number listed on your records the next business day following  your procedure to check on you and address any questions or concerns that you may have at that time regarding the information given to you following your procedure. This is a courtesy call and so if there is no answer at the home number and we have not heard from you through the emergency physician on call, we will assume that you have returned to your regular daily activities without incident.  SIGNATURES/CONFIDENTIALITY: You and/or your care partner have signed paperwork which will be entered into your electronic medical record.  These  signatures attest to the fact that that the information above on your After Visit Summary has been reviewed and is understood.  Full responsibility of the confidentiality of this discharge information lies with you and/or your care-partner.  Wait biopsy results   Repeat colonoscopy in 5 years

## 2012-07-21 NOTE — Progress Notes (Signed)
Called to room to assist during endoscopic procedure.  Patient ID and intended procedure confirmed with present staff. Received instructions for my participation in the procedure from the performing physician.  

## 2012-07-21 NOTE — Op Note (Signed)
[place]  [address] [place], [address]   COLONOSCOPY PROCEDURE REPORT  PATIENT: [name], [name]  MR#: [id number] BIRTHDATE: 06/12/1965 , 46  yrs. old GENDER: Female ENDOSCOPIST: Gatha Mayer, MD, [facility] REFERRED SN:[id number] Wolfgang Phoenix, M.D. PROCEDURE DATE:  07/21/2012 PROCEDURE:   Colonoscopy, diagnostic ASA CLASS:   Class II INDICATIONS:Weight loss.   Hx of Lymphoma and Family hx multiple cancers MEDICATIONS: There was residual sedation effect present from prior procedure, Propofol (Diprivan) 380 mg IV, MAC sedation, administered by CRNA, and These medications were titrated to patient response per physician's verbal order  DESCRIPTION OF PROCEDURE:   After the risks benefits and alternatives of the procedure were thoroughly explained, informed consent was obtained.  A digital rectal exam revealed no abnormalities of the rectum.   The LB [id number]  endoscope was introduced through the anus and advanced to the cecum, which was identified by both the appendix and ileocecal valve. No adverse events experienced.   The quality of the prep was excellent using Suprep  The instrument was then slowly withdrawn as the colon was fully examined.      COLON FINDINGS: A normal appearing cecum, ileocecal valve, and appendiceal orifice were identified.  The ascending, hepatic flexure, transverse, splenic flexure, descending, sigmoid colon and rectum appeared unremarkable.  No polyps or cancers were seen. The mucosa appeared normal in the terminal ileum.  Retroflexed views revealed no abnormalities. The time to cecum=1 minutes 40 seconds.  Withdrawal time=7 minutes 51 seconds.  The scope was withdrawn and the procedure completed. COMPLICATIONS: There were no complications.  ENDOSCOPIC IMPRESSION: 1.   Normal colon 2.   Normal mucosa in the terminal ileum  RECOMMENDATIONS: 1.  Repeat Colonoscopy in 5 years. 2.   she has a strong family hx of GYN and breast  cancer and a grandmother w/ colon cancer.   eSigned:  Gatha Mayer, MD, [facility] 07/21/2012 3:57 PM   cc: [name], MD and The Patient

## 2012-07-21 NOTE — Progress Notes (Signed)
Patient did not experience any of the following events: a burn prior to discharge; a fall within the facility; wrong site/side/patient/procedure/implant event; or a hospital transfer or hospital admission upon discharge from the facility. (G8907) Patient did not have preoperative order for IV antibiotic SSI prophylaxis. (G8918)  

## 2012-07-21 NOTE — Op Note (Signed)
[place]  [address] [place], [address]   ENDOSCOPY PROCEDURE REPORT  PATIENT: Kynnedi, Zweig  MR#: [id number] BIRTHDATE: 1966/02/24 , 46  yrs. old GENDER: Female ENDOSCOPIST: Gatha Mayer, MD, [facility] REFERRED BY:  [name], M.D. PROCEDURE DATE:  07/21/2012 PROCEDURE:  EGD w/ biopsy ASA CLASS:     Class II INDICATIONS:  abdominal pain in upper left quadrant.   Weight loss. Hx of lymphoma MEDICATIONS: propofol (Diprivan) 271m IV, MAC sedation, administered by CRNA, and These medications were titrated to patient response per physician's verbal order TOPICAL ANESTHETIC: Cetacaine Spray  DESCRIPTION OF PROCEDURE: After the risks benefits and alternatives of the procedure were thoroughly explained, informed consent was obtained.  The LB [id number] was introduced through the mouth and advanced to the second portion of the duodenum. Without limitations.  The instrument was slowly withdrawn as the mucosa was fully examined.        DUODENUM: Abnormal mucosa was found in the duodenal bulb and 2nd part of the duodenum.  Fissured mucosa raising ? of celiac disease. Multiple biopsies were performed using cold forceps.  Sample sent for histology.  The remainder of the upper endoscopy exam was otherwise normal. Retroflexed views revealed no abnormalities.     The scope was then withdrawn from the patient and the procedure completed.  COMPLICATIONS: There were no complications. ENDOSCOPIC IMPRESSION: 1.   Abnormal mucosa (? celiac changes) was found in the duodenal bulb and 2nd part of the duodenum; multiple biopsies 2.   The remainder of the upper endoscopy exam was otherwise normal  RECOMMENDATIONS: 1.  Await pathology results 2.  Proceed with a Colonoscopy.   eSigned:  CGatha Mayer MD, [facility]05/22/2014 3:53 PM   CEL:[id number] Phoenix MD and The Patient

## 2012-07-22 ENCOUNTER — Telehealth: Payer: Self-pay | Admitting: *Deleted

## 2012-07-22 NOTE — Telephone Encounter (Signed)
Name identifier, no message left, follow-up

## 2012-07-29 ENCOUNTER — Other Ambulatory Visit: Payer: Self-pay

## 2012-07-29 DIAGNOSIS — R109 Unspecified abdominal pain: Secondary | ICD-10-CM

## 2012-07-29 DIAGNOSIS — R634 Abnormal weight loss: Secondary | ICD-10-CM

## 2012-07-29 NOTE — Progress Notes (Signed)
Quick Note:  Please call from office  Biopsies show inflammation in duodenum - pathologist favors acid-peptic injury I can see that she had a celiac panel ordered in 2013 but no results - please check into that - was it ever completed (ordered by oncologist in [place]) - if we cannot get those results do a TTG Ab and IgA level  [facility] - no letter or recall ______

## 2012-08-01 ENCOUNTER — Ambulatory Visit (HOSPITAL_COMMUNITY): Payer: 59 | Admitting: Oncology

## 2012-09-09 ENCOUNTER — Other Ambulatory Visit: Payer: Self-pay

## 2012-09-09 DIAGNOSIS — Z1231 Encounter for screening mammogram for malignant neoplasm of breast: Secondary | ICD-10-CM

## 2012-09-14 ENCOUNTER — Ambulatory Visit (HOSPITAL_COMMUNITY): Payer: 59 | Admitting: Oncology

## 2012-09-14 NOTE — Progress Notes (Signed)
-  No show, letter sent-

## 2012-09-20 ENCOUNTER — Ambulatory Visit (INDEPENDENT_AMBULATORY_CARE_PROVIDER_SITE_OTHER): Payer: 59 | Admitting: Family Medicine

## 2012-09-20 ENCOUNTER — Encounter: Payer: Self-pay | Admitting: Family Medicine

## 2012-09-20 VITALS — BP 112/78 | Wt 145.2 lb

## 2012-09-20 DIAGNOSIS — R109 Unspecified abdominal pain: Secondary | ICD-10-CM

## 2012-09-20 DIAGNOSIS — R5381 Other malaise: Secondary | ICD-10-CM

## 2012-09-20 DIAGNOSIS — E538 Deficiency of other specified B group vitamins: Secondary | ICD-10-CM

## 2012-09-20 DIAGNOSIS — D509 Iron deficiency anemia, unspecified: Secondary | ICD-10-CM

## 2012-09-20 MED ORDER — PREGABALIN 50 MG PO CAPS: 50.0000 mg | ORAL_CAPSULE | Freq: Three times a day (TID) | ORAL | Status: DC

## 2012-09-20 NOTE — Patient Instructions (Signed)
Start Lyrica: one at bedtime for 7 days then one twice daily for 7 days then one 3 times a day

## 2012-09-20 NOTE — Progress Notes (Signed)
  Subjective:    Patient ID: [name], female    DOB: 24-Mar-1965, 47 y.o.   MRN: [id number]  Back Pain This is a chronic problem. The current episode started more than 1 year ago.   patient has chronic discomfort in the flank side multiple testings been done she relates that she's having more just soreness and some intermittent abdominal discomfort She also has severe fibromyalgia causing a lot of pain and discomfort she would like to try some medication to help it we discussed Lyrica PMH as per above family history noted social doesn't smoke  Review of Systems  Musculoskeletal: Positive for back pain.   Review of findings from recent endoscopy reviewed with patient in detail.    Objective:   Physical Exam  Her lungs are clear hearts regular pulse normal subjective discomfort in the lower back also in the muscles of the arms and legs. Abdomen is soft no guarding rebound or tenderness.      Assessment & Plan:  Abdominal  pain, other specified site - Plan: CBC with Differential, Reticulin Antibody, IgA w reflex titer, Tissue transglutaminase, IgA  Other malaise and fatigue - Plan: Vitamin D 25 hydroxy, Vitamin B12, Methylmalonic acid, serum  Anemia, iron deficiency - Plan: Ferritin  B12 deficiency  fibromyalgia-trying Lyrica see if that will help. Patient followup 3 months

## 2012-09-29 ENCOUNTER — Ambulatory Visit
Admission: RE | Admit: 2012-09-29 | Discharge: 2012-09-29 | Disposition: A | Discharge status: Home / Self Care | Payer: 59 | Source: Ambulatory Visit

## 2012-09-29 DIAGNOSIS — Z1231 Encounter for screening mammogram for malignant neoplasm of breast: Secondary | ICD-10-CM

## 2012-10-19 ENCOUNTER — Encounter (HOSPITAL_COMMUNITY): Payer: Self-pay | Admitting: Oncology

## 2012-11-07 ENCOUNTER — Other Ambulatory Visit: Payer: Self-pay | Admitting: Family Medicine

## 2012-11-07 LAB — CBC WITH DIFFERENTIAL/PLATELET
Basophils Absolute: 0.1 10*3/uL (ref 0.0–0.1)
Basophils Relative: 1 % (ref 0–1)
Eosinophils Absolute: 0.2 10*3/uL (ref 0.0–0.7)
Eosinophils Relative: 2 % (ref 0–5)
HCT: 36.6 % (ref 36.0–46.0)
Hemoglobin: 11.7 g/dL — ABNORMAL LOW (ref 12.0–15.0)
MCH: 29.1 pg (ref 26.0–34.0)
MCHC: 32 g/dL (ref 30.0–36.0)
MCV: 91 fL (ref 78.0–100.0)
Monocytes Absolute: 1.6 10*3/uL — ABNORMAL HIGH (ref 0.1–1.0)
Monocytes Relative: 17 % — ABNORMAL HIGH (ref 3–12)
Neutro Abs: 5.3 10*3/uL (ref 1.7–7.7)
RDW: 13.5 % (ref 11.5–15.5)

## 2012-11-07 NOTE — Telephone Encounter (Signed)
Refill x4

## 2012-11-09 ENCOUNTER — Other Ambulatory Visit: Payer: Self-pay | Admitting: *Deleted

## 2012-11-09 ENCOUNTER — Telehealth: Payer: Self-pay | Admitting: Family Medicine

## 2012-11-09 DIAGNOSIS — R109 Unspecified abdominal pain: Secondary | ICD-10-CM

## 2012-11-09 DIAGNOSIS — D509 Iron deficiency anemia, unspecified: Secondary | ICD-10-CM

## 2012-11-09 NOTE — Telephone Encounter (Signed)
Patient would like someone to call her with her lab results that were drawn on 11/07/2012

## 2012-11-09 NOTE — Telephone Encounter (Signed)
Message sent to nurse see lab results

## 2012-11-09 NOTE — Telephone Encounter (Signed)
Discussed with patient

## 2012-11-10 LAB — METHYLMALONIC ACID, SERUM: Methylmalonic Acid, Quant: 0.23 umol/L

## 2012-11-14 ENCOUNTER — Ambulatory Visit (INDEPENDENT_AMBULATORY_CARE_PROVIDER_SITE_OTHER): Payer: 59 | Admitting: Internal Medicine

## 2012-11-14 ENCOUNTER — Other Ambulatory Visit (INDEPENDENT_AMBULATORY_CARE_PROVIDER_SITE_OTHER): Payer: 59

## 2012-11-14 ENCOUNTER — Encounter: Payer: Self-pay | Admitting: Internal Medicine

## 2012-11-14 VITALS — BP 90/60 | HR 60 | Ht 63.5 in | Wt 145.0 lb

## 2012-11-14 DIAGNOSIS — Z23 Encounter for immunization: Secondary | ICD-10-CM

## 2012-11-14 DIAGNOSIS — K900 Celiac disease: Secondary | ICD-10-CM

## 2012-11-14 DIAGNOSIS — R634 Abnormal weight loss: Secondary | ICD-10-CM

## 2012-11-14 DIAGNOSIS — Z92241 Personal history of systemic steroid therapy: Secondary | ICD-10-CM

## 2012-11-14 DIAGNOSIS — R109 Unspecified abdominal pain: Secondary | ICD-10-CM

## 2012-11-14 LAB — IGA: IgA: 238 mg/dL (ref 69–380)

## 2012-11-14 MED ORDER — CYANOCOBALAMIN 1000 MCG/ML IJ SOLN: 1000.0000 ug | Freq: Once | INTRAMUSCULAR | Status: DC

## 2012-11-14 NOTE — Patient Instructions (Addendum)
  Your appointment for Bone Scan is 11-16-2012 at 3:30 pm but be there at 3:15 pm for registration  You were scheduled for a Bone Destiny Scan at  [facility] Address: [place]  Phone: [phone]  Forestine Na will contact you about your Dietician appointment   Your physician has requested that you go to the basement for lab work before leaving today.  Please come back in three months for lab work(TTG Antibody)----February 13, 2013 Lab is open from 7:30 am to 5:30 pm, Monday through Friday  Please follow up with Dr. Carlean Purl in six months   You received a Vitamin B 12 injection and the Flu injection today  _____________________________________________________________________________________________________________________________                                               We are excited to introduce MyChart, a new best-in-class service that provides you online access to important information in your electronic medical record. We want to make it easier for you to view your health information - all in one secure location - when and where you need it. We expect MyChart will enhance the quality of care and service we provide.  When you register for MyChart, you can:    View your test results.    Request appointments and receive appointment reminders via email.    Request medication renewals.    View your medical history, allergies, medications and immunizations.    Communicate with your physician's office through a password-protected site.    Conveniently print information such as your medication lists.  To find out if MyChart is right for you, please talk to a member of our clinical staff today. We will gladly answer your questions about this free health and wellness tool.  If you are age 47 or older and want a member of your family to have access to your record, you must provide written consent by completing a proxy form available at  our office. Please speak to our clinical staff about guidelines regarding accounts for patients younger than age 47.  As you activate your MyChart account and need any technical assistance, please call the MyChart technical support line at [phone]) or email your question to [email or web address]. If you email your question(s), please include your name, a return phone number and the best time to reach you.  If you have non-urgent health-related questions, you can send a message to our office through [place] at [email or web address]. If you have a medical emergency, call 911.  Thank you for using MyChart as your new health and wellness resource!   MyChart licensed from [employer],  1999-2010. Patents Pending.

## 2012-11-15 LAB — TISSUE TRANSGLUTAMINASE, IGA: Tissue Transglutaminase Ab, IgA: 183 U/mL — ABNORMAL HIGH (ref <20)

## 2012-11-16 ENCOUNTER — Other Ambulatory Visit (HOSPITAL_COMMUNITY): Payer: 59

## 2012-11-16 DIAGNOSIS — Z92241 Personal history of systemic steroid therapy: Secondary | ICD-10-CM | POA: Insufficient documentation

## 2012-11-16 DIAGNOSIS — K900 Celiac disease: Secondary | ICD-10-CM | POA: Insufficient documentation

## 2012-11-16 NOTE — Assessment & Plan Note (Signed)
DEXA scan

## 2012-11-16 NOTE — Assessment & Plan Note (Addendum)
Antibodies show that she has this as do bxs of duodenum. It can explain many of her symptoms. Will evaluate for persistent folate deficiency and other deficiencies of vitamins and minerals associated w/ celiac dz. She will undergo dietitian consult and I have provided web info and sites to her. Recheck labs in 3 months likely and a f/u visit in 6 months most likely if not sooner. B12 injection today. She has had borderline level. Influenza vaccination also.

## 2012-11-16 NOTE — Progress Notes (Signed)
  Subjective:    Patient ID: [name], female    DOB: Oct 01, 1965, 47 y.o.   MRN: [id number]  HPI [name] is here in follow-up after Dr. Wolfgang Phoenix discovered she had not undergone celiac testing as recommended to her after duodenal bxs suggested celiac dz in the Spring. She has a lot of fatigue, body aches and loose bowels. She has been diagnosed with fibromyalgia. She has tested + with a markedly elvated TTG IgA Ab c/w celiac dz. She has not yet undergone dietitian counseling but has begun to investigate gluten-free diets.  Medications, allergies, past medical history, past surgical history, family history and social history are reviewed and updated in the EMR.   Review of Systems As above    Objective:   Physical Exam WDWN NAD    Assessment & Plan:  Celiac disease - Plan: Vitamin A, Vitamin E, Folate, Magnesium, Vitamin B1, Vitamin B6, DG Bone Density, Carotene, serum, t-Transglutaminase (tTG) IgG, cyanocobalamin ((VITAMIN B-12)) injection 1,000 mcg, Referral to Nutrition and Diabetes Services, CANCELED: Referral to Nutrition and Diabetes Services  History of systemic steroid therapy - Plan: Vitamin A, Vitamin E, Folate, Magnesium, Vitamin B1, Vitamin B6, DG Bone Density, Carotene, serum, t-Transglutaminase (tTG) IgG, cyanocobalamin ((VITAMIN B-12)) injection 1,000 mcg, Referral to Nutrition and Diabetes Services, CANCELED: Referral to Nutrition and Diabetes Services  Need for immunization against influenza - Plan: Flu Vaccine QUAD 36+ mos IM, Folate, Magnesium, Vitamin B1, Vitamin B6, DG Bone Density, Carotene, serum, t-Transglutaminase (tTG) IgG, cyanocobalamin ((VITAMIN B-12)) injection 1,000 mcg, Referral to Nutrition and Diabetes Services, CANCELED: Referral to Nutrition and Diabetes Services

## 2012-11-17 LAB — CAROTENE, SERUM: Carotene, Total-Serum: 11 ug/dL (ref 6–77)

## 2012-11-18 LAB — VITAMIN A: Vitamin A (Retinoic Acid): 45 ug/dL (ref 38–98)

## 2012-11-18 LAB — VITAMIN E: Vitamin E (Alpha Tocopherol): 5.8 mg/L (ref 5.7–19.9)

## 2012-11-21 ENCOUNTER — Telehealth: Payer: Self-pay | Admitting: Internal Medicine

## 2012-11-22 ENCOUNTER — Encounter: Payer: Self-pay | Admitting: Internal Medicine

## 2012-11-22 ENCOUNTER — Other Ambulatory Visit: Payer: Self-pay

## 2012-11-22 DIAGNOSIS — K900 Celiac disease: Secondary | ICD-10-CM

## 2012-11-22 NOTE — Progress Notes (Signed)
Quick Note:  No deficiencies noted She needs TTG Ab levels in 3 months and a follow-up visit in 6 months ______

## 2012-11-22 NOTE — Telephone Encounter (Signed)
I have left a message that her lab work was normal.  She is asked to call for any questions or concerns

## 2012-12-01 ENCOUNTER — Encounter (HOSPITAL_COMMUNITY): Payer: Self-pay | Admitting: Oncology

## 2012-12-15 ENCOUNTER — Other Ambulatory Visit: Payer: Self-pay | Admitting: Family Medicine

## 2012-12-20 ENCOUNTER — Telehealth: Payer: Self-pay | Admitting: Family Medicine

## 2012-12-20 NOTE — Telephone Encounter (Signed)
May try tylenol or can prescribe Tramadol , if ongoing then will need to be seen, (Tramadol 50 mg , 1 q 6 hours prn ,#30,0 refills ) caution drow

## 2012-12-20 NOTE — Telephone Encounter (Signed)
Transferred patient to front desk to schedule appointment.

## 2012-12-20 NOTE — Telephone Encounter (Signed)
Patient is still having severe back pain-She has took ibuprofen, but due to celiac she is not supposed to take this. She would like to know what the next step is on this.

## 2012-12-21 ENCOUNTER — Encounter: Payer: Self-pay | Admitting: Family Medicine

## 2012-12-21 ENCOUNTER — Ambulatory Visit (INDEPENDENT_AMBULATORY_CARE_PROVIDER_SITE_OTHER): Payer: 59 | Admitting: Family Medicine

## 2012-12-21 VITALS — BP 118/70 | Ht 63.5 in | Wt 147.8 lb

## 2012-12-21 DIAGNOSIS — E538 Deficiency of other specified B group vitamins: Secondary | ICD-10-CM

## 2012-12-21 DIAGNOSIS — M546 Pain in thoracic spine: Secondary | ICD-10-CM

## 2012-12-21 DIAGNOSIS — E039 Hypothyroidism, unspecified: Secondary | ICD-10-CM

## 2012-12-21 NOTE — Patient Instructions (Signed)
Call us in Jan 2015 for labs papers for follow up (TSH, B12 etc)

## 2012-12-21 NOTE — Progress Notes (Signed)
  Subjective:    Patient ID: Susan [name], female    DOB: 05/29/1965, 47 y.o.   MRN: [id number]  HPI Patient is here today for chronic back pain.  She was last seen here 7/22. She had a MRI. At that time her MRI which was in February 2014 showed a bone marrow change that was felt do to previous chemotherapy issues. In addition to this patient has not had any fever sweats chills or weight loss. She does have celiac disease for which she is gone on dietary measures. Patient states the pain occurs every single day in the mid thoracic area radiates to the paraspinal regions. Hurts with movement. Does wake her up at times. Is getting progressive. She would like to try anything other than medications. She is interested in possibly getting further opinions from [place] back specialist. She denies wheezing difficulty breathing nausea vomiting diarrhea hematuria. Pt is still suffering from the back pain.  Radiologist had recommended that the patient have a repeat MRI to make sure this bone marrow issue is not related to her previous cancer. It was recommended to do the bone MRI proximally 6 months after the previous MRI. It is now 8 months.  Family history noncontributory past medical history lymphoma many years ago   Review of Systems see above     Objective:   Physical Exam Lungs are clear hearts regular thoracic spine mild to moderate tenderness negative straight leg raise fairly good range of motion       Assessment & Plan:  Thoracic MRI to relook at this area, depending on the findings may need further input May need specialist input labwork in 3 months to look at her B12 vitamin D [name] and TSH. Patient did have a borderline B12 deficiency Dr. Carlean Purl gave her a shot of B12 I recommended that the patient use oral B12 2000 mcg daily we'll recheck [name] in 3 months Celiac disease she is following the diet closely it seems to be helping I did encourage her to see a nutritionist Hypothyroidism  patient is take medication we'll recheck levels in 3 months

## 2012-12-26 ENCOUNTER — Ambulatory Visit (HOSPITAL_COMMUNITY): Payer: 59

## 2012-12-26 NOTE — Progress Notes (Signed)
[name], MD [place] [address]  NHL (non-Hodgkin's lymphoma) - Plan: cyclobenzaprine (FLEXERIL) 10 MG tablet, Cholecalciferol 5000 UNITS capsule, cyanocobalamin 7793 MCG tablet, folic acid (FOLVITE) 1 MG tablet, CBC with Differential, Comprehensive metabolic panel, Lactate dehydrogenase, Sedimentation rate, TSH, CBC with Differential, Comprehensive metabolic panel, Lactate dehydrogenase, Sedimentation rate, TSH  Low ferritin - Plan: Iron and TIBC, Ferritin, Iron and TIBC, Ferritin  CURRENT THERAPY:Surveillance  INTERVAL HISTORY: [name] 47 y.o. 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 Nila Nephew and a mini mantle setup presenting here for the first time on 05/21/2000, finishing chemotherapy as of 09/22/2000, then radiation.   I personally reviewed and went over laboratory results with the patient.  Labs from 11/07/2012 shows a WBC of 9.6, Hgb 11.7 g/dL, and platelet count of 459,000.   Ferritin is noted to be 10.  Last LDH was from May 2014 and was WNL.   I personally reviewed and went over radiographic studies with the patient.  Last mammogram from August 2014 was BIRADS 1.  She will be due next year in August 2015 for her next mammogram.   We reviewed the NCCN guidelines for Non-Hodgkin's Lymphoma.  NCCN guidelines recommends the following surveillance for Stage I/II Non-Hodgkin's Diffuse, Large B-Cell Lymphoma:  A. H&P and labs every 3-6 months for 5 years and then yearly or as clinically indicated.    B. Repeat CT scan only as indicated.   She admits to decreased energy, but Hgb is only minimally anemic.  Her ferritin was noted to be 10 last.  We will check iron studies today.   She already had her influenza vaccine this year.   She was recently diagnosed with celiac's disease and therefore eats a gluten free diet.  This has helped her overall well being thus far.    Hematologically, she denies any complaints and ROS questioning is negative.  She denies any B symptoms.   Past Medical History  Diagnosis Date  . Iron deficiency anemia   . Depression   . Thyroid disease     hypothyroidism  . Low HDL (under 40)   . Fibromyalgia   . 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 Nila Nephew and a mini mantle setup presenting here for the first time on 05/21/2000, finishing chemotherapy as of 09/22/2000, then radiation.    . Folate deficiency 07/09/2012    Started on Folic Acid on 11/03/90  . Celiac disease     has Upper back pain on right side; Neuropathic pain; Unspecified hypothyroidism; NHL (non-Hodgkin's lymphoma); Folate deficiency; Loss of weight; LUQ pain; Celiac disease; History of systemic steroid therapy; and Thoracic spine pain on her problem list.     is allergic to avelox and codeine.  Ms. Neyer had no medications administered during this visit.  Past Surgical History  Procedure Laterality Date  . Back surgery      artificial disc in lumbar area  . Incontinence surgery    . Colonoscopy    . Breast biopsy      X 2 both benigh  . Tubal ligation      Denies any headaches, dizziness, double vision, fevers, chills, night sweats, nausea, vomiting, diarrhea, constipation, chest pain, heart palpitations, shortness of breath, blood in  stool, black tarry stool, urinary pain, urinary burning, urinary frequency, hematuria.   PHYSICAL EXAMINATION  ECOG PERFORMANCE STATUS: 0 - Asymptomatic  Filed Vitals:   12/27/12 1154  BP: 100/69  Pulse: 93  Temp: 98.2 F (36.8 C)  Resp: 20    GENERAL:alert, no distress, well nourished, well developed, comfortable, cooperative and smiling SKIN: skin color, texture, turgor are normal, no rashes or significant lesions HEAD: Normocephalic,  No masses, lesions, tenderness or abnormalities EYES: normal, PERRLA, EOMI, Conjunctiva are pink and non-injected EARS: External ears normal OROPHARYNX:mucous membranes are moist  NECK: supple, no adenopathy, thyroid normal size, non-tender, without nodularity, no stridor, non-tender, trachea midline LYMPH:  no palpable lymphadenopathy, no hepatosplenomegaly BREAST:not examined LUNGS: clear to auscultation and percussion HEART: regular rate & rhythm, no murmurs, no gallops, S1 normal and S2 normal ABDOMEN:abdomen soft, non-tender, normal bowel sounds, no masses or organomegaly and no hepatosplenomegaly BACK: Back symmetric, no curvature., No CVA tenderness EXTREMITIES:less then 2 second capillary refill, no joint deformities, effusion, or inflammation, no edema, no skin discoloration, no clubbing, no cyanosis, flattening of fingernails  NEURO: alert & oriented x 3 with fluent speech, no focal motor/sensory deficits, gait normal   LABORATORY DATA: CBC    Component Value Date/Time   WBC 9.6 11/07/2012 1458   RBC 4.02 11/07/2012 1458   HGB 11.7* 11/07/2012 1458   HCT 36.6 11/07/2012 1458   PLT 459* 11/07/2012 1458   MCV 91.0 11/07/2012 1458   MCH 29.1 11/07/2012 1458   MCHC 32.0 11/07/2012 1458   RDW 13.5 11/07/2012 1458   LYMPHSABS 2.4 11/07/2012 1458   MONOABS 1.6* 11/07/2012 1458   EOSABS 0.2 11/07/2012 1458   BASOSABS 0.1 11/07/2012 1458      Chemistry      Component Value Date/Time   NA 139 07/08/2012 1158   K 4.9 07/08/2012 1158   CL 102 07/08/2012 1158   CO2 27 07/08/2012 1158   BUN 13 07/08/2012 1158   CREATININE 0.67 07/08/2012 1158      Component Value Date/Time   CALCIUM 9.1 07/08/2012 1158   ALKPHOS 84 07/08/2012 1158   AST 33 07/08/2012 1158   ALT 36* 07/08/2012 1158   BILITOT 0.3 07/08/2012 1158     Results for TITANIA, GAULT (MRN [id number]) as of 12/26/2012 17:32  Ref. Range 07/08/2012 11:58  LDH Latest Range: 94-250 U/L 180     RADIOGRAPHIC STUDIES:  09/30/2012  *RADIOLOGY REPORT*  Clinical  Data: Screening.  DIGITAL SCREENING BILATERAL MAMMOGRAM WITH CAD  DIGITAL BREAST TOMOSYNTHESIS  Digital breast tomosynthesis images are acquired in two  projections. These images are reviewed in combination with the  digital mammogram, confirming the findings below.  Comparison: Previous exam(s).  FINDINGS:  ACR Breast Density Category d: The breasts tissue is extremely  dense, which lowers the sensitivity of mammography.  There are no findings suspicious for malignancy.  Images were processed with CAD.  IMPRESSION:  No mammographic evidence of malignancy.  A result letter of this screening mammogram will be mailed directly  to the patient.  RECOMMENDATION:  Screening mammogram in one year. (Code:SM-B-01Y)  BI-RADS CATEGORY 1: Negative.  Original Report Authenticated By: Conchita Paris, M.D.     ASSESSMENT:  1. 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 Nila Nephew and a mini mantle setup presenting here for the first time on 05/21/2000, finishing chemotherapy as of 09/22/2000, then radiation.  2. Memory issues and she is following up  with Dr. Floyde Parkins.  3. Iron deficiency in the past.  4. Postpartum depression in the past.  5. Fibromyalgia followed by Dr. Amil Amen in [place].  6. Codeine intolerance.  7. Benign lump in her breast status post resection.  8. Hypothyroidism status post radiation and she is on replacement Synthroid now 100 mcg a day, and her TSH has not been checked in 3 months she thinks so we will check that.  9. Two uneventful pregnancies, labor and deliveries with 2 sons both in good health. One is 40 and one is in his early 71s.  10. Intermittent left hip discomfort in the past.  Patient Active Problem List   Diagnosis Date Noted  . Thoracic spine pain 12/21/2012  . Celiac disease 11/16/2012  . History of systemic steroid therapy 11/16/2012  . Loss of weight 07/13/2012  . LUQ  pain 07/13/2012  . Folate deficiency 07/09/2012  . NHL (non-Hodgkin's lymphoma) 07/08/2012  . Unspecified hypothyroidism 07/06/2012  . Upper back pain on right side 06/06/2012  . Neuropathic pain 06/06/2012     PLAN:  1. I personally reviewed and went over laboratory results with the patient. 2. I personally reviewed and went over radiographic studies with the patient. 3. Next screening mammogram in August 2015. 4. Labs today: CBC diff, CMET, LDH, ESR, TSH, iron/TIBC, ferritin 5. Influenza vaccine already given. 6. Reviewed NCCN guidelines for surveillance 7. Return in 1 year for follow-up   THERAPY PLAN:  Will continue to follow NCCN guidelines for surveillance.  NCCN guidelines recommends the following surveillance for Stage I/II Non-Hodgkin's Diffuse, Large B-Cell Lymphoma:  A. H&P and labs every 3-6 months for 5 years and then yearly or as clinically indicated.    B. Repeat CT scan only as indicated.    All questions were answered. The patient knows to call the clinic with any problems, questions or concerns. We can certainly see the patient much sooner if necessary.  Patient and plan discussed with Dr. Farrel Gobble and he is in agreement with the aforementioned.   Ayham Word

## 2012-12-27 ENCOUNTER — Encounter (HOSPITAL_COMMUNITY): Payer: 59 | Attending: Oncology | Admitting: Oncology

## 2012-12-27 ENCOUNTER — Encounter (HOSPITAL_COMMUNITY): Payer: Self-pay | Admitting: Oncology

## 2012-12-27 VITALS — BP 100/69 | HR 93 | Temp 98.2°F | Resp 20 | Wt 144.9 lb

## 2012-12-27 DIAGNOSIS — R799 Abnormal finding of blood chemistry, unspecified: Secondary | ICD-10-CM

## 2012-12-27 DIAGNOSIS — R790 Abnormal level of blood mineral: Secondary | ICD-10-CM

## 2012-12-27 DIAGNOSIS — E039 Hypothyroidism, unspecified: Secondary | ICD-10-CM

## 2012-12-27 DIAGNOSIS — C8590 Non-Hodgkin lymphoma, unspecified, unspecified site: Secondary | ICD-10-CM

## 2012-12-27 DIAGNOSIS — C8589 Other specified types of non-Hodgkin lymphoma, extranodal and solid organ sites: Secondary | ICD-10-CM

## 2012-12-27 LAB — CBC WITH DIFFERENTIAL/PLATELET
Basophils Absolute: 0.1 10*3/uL (ref 0.0–0.1)
Basophils Relative: 1 % (ref 0–1)
Eosinophils Absolute: 0.2 10*3/uL (ref 0.0–0.7)
HCT: 38.1 % (ref 36.0–46.0)
Hemoglobin: 12.9 g/dL (ref 12.0–15.0)
Lymphocytes Relative: 26 % (ref 12–46)
Lymphs Abs: 1.8 10*3/uL (ref 0.7–4.0)
MCH: 30.9 pg (ref 26.0–34.0)
MCHC: 33.9 g/dL (ref 30.0–36.0)
Monocytes Absolute: 1 10*3/uL (ref 0.1–1.0)
Monocytes Relative: 14 % — ABNORMAL HIGH (ref 3–12)
Neutro Abs: 3.9 10*3/uL (ref 1.7–7.7)
Neutrophils Relative %: 56 % (ref 43–77)
RDW: 15.3 % (ref 11.5–15.5)
WBC: 6.9 10*3/uL (ref 4.0–10.5)

## 2012-12-27 LAB — COMPREHENSIVE METABOLIC PANEL
AST: 31 U/L (ref 0–37)
Albumin: 3.9 g/dL (ref 3.5–5.2)
BUN: 15 mg/dL (ref 6–23)
Chloride: 102 mEq/L (ref 96–112)
Creatinine, Ser: 0.79 mg/dL (ref 0.50–1.10)
GFR calc non Af Amer: >90 mL/min (ref >90)
Sodium: 140 mEq/L (ref 135–145)
Total Protein: 7.3 g/dL (ref 6.0–8.3)

## 2012-12-27 LAB — LACTATE DEHYDROGENASE: LDH: 150 U/L (ref 94–250)

## 2012-12-27 LAB — SEDIMENTATION RATE: Sed Rate: 5 mm/hr (ref 0–22)

## 2012-12-27 NOTE — Patient Instructions (Signed)
.  [facility] Discharge Instructions  RECOMMENDATIONS MADE BY THE CONSULTANT AND ANY TEST RESULTS WILL BE SENT TO YOUR REFERRING PHYSICIAN.  EXAM FINDINGS BY THE PHYSICIAN TODAY AND SIGNS OR SYMPTOMS TO REPORT TO CLINIC OR PRIMARY PHYSICIAN:  Exam and findings as discussed by T.Kefalas PA.  Report any fevers, night sweats, or new lumps INSTRUCTIONS/FOLLOW-UP: 1 year  Thank you for choosing [facility] to provide your oncology and hematology care.  To afford each patient quality time with our providers, please arrive at least 15 minutes before your scheduled appointment time.  With your help, our goal is to use those 15 minutes to complete the necessary work-up to ensure our physicians have the information they need to help with your evaluation and healthcare recommendations.    Effective January 1st, 2014, we ask that you re-schedule your appointment with our physicians should you arrive 10 or more minutes late for your appointment.  We strive to give you quality time with our providers, and arriving late affects you and other patients whose appointments are after yours.    Again, thank you for choosing [facility].  Our hope is that these requests will decrease the amount of time that you wait before being seen by our physicians.       _____________________________________________________________  Should you have questions after your visit to [facility], please contact our office at [phone] between the hours of 8:30 a.m. and 5:00 p.m.  Voicemails left after 4:30 p.m. will not be returned until the following business day.  For prescription refill requests, have your pharmacy contact our office with your prescription refill request.

## 2012-12-28 LAB — IRON AND TIBC
Iron: 113 ug/dL (ref 42–135)
Saturation Ratios: 32 % (ref 20–55)
TIBC: 348 ug/dL (ref 250–470)

## 2012-12-28 LAB — TSH: TSH: 3.398 u[IU]/mL (ref 0.350–4.500)

## 2013-01-12 ENCOUNTER — Encounter: Payer: Self-pay | Admitting: Internal Medicine

## 2013-01-12 ENCOUNTER — Telehealth: Payer: Self-pay | Admitting: Neurology

## 2013-01-17 ENCOUNTER — Ambulatory Visit (HOSPITAL_COMMUNITY)
Admission: RE | Admit: 2013-01-17 | Discharge: 2013-01-17 | Disposition: A | Discharge status: Home / Self Care | Payer: 59 | Source: Ambulatory Visit | Attending: Internal Medicine | Admitting: Internal Medicine

## 2013-01-17 ENCOUNTER — Ambulatory Visit (HOSPITAL_COMMUNITY)
Admission: RE | Admit: 2013-01-17 | Discharge: 2013-01-17 | Disposition: A | Discharge status: Home / Self Care | Payer: 59 | Source: Ambulatory Visit | Attending: Family Medicine | Admitting: Family Medicine

## 2013-01-17 DIAGNOSIS — E559 Vitamin D deficiency, unspecified: Secondary | ICD-10-CM | POA: Insufficient documentation

## 2013-01-17 DIAGNOSIS — Z23 Encounter for immunization: Secondary | ICD-10-CM

## 2013-01-17 DIAGNOSIS — M899 Disorder of bone, unspecified: Secondary | ICD-10-CM | POA: Insufficient documentation

## 2013-01-17 DIAGNOSIS — IMO0002 Reserved for concepts with insufficient information to code with codable children: Secondary | ICD-10-CM | POA: Insufficient documentation

## 2013-01-17 DIAGNOSIS — Z87898 Personal history of other specified conditions: Secondary | ICD-10-CM | POA: Insufficient documentation

## 2013-01-17 DIAGNOSIS — K900 Celiac disease: Secondary | ICD-10-CM

## 2013-01-17 DIAGNOSIS — Z92241 Personal history of systemic steroid therapy: Secondary | ICD-10-CM

## 2013-01-17 DIAGNOSIS — M546 Pain in thoracic spine: Secondary | ICD-10-CM | POA: Insufficient documentation

## 2013-01-17 DIAGNOSIS — D1809 Hemangioma of other sites: Secondary | ICD-10-CM | POA: Insufficient documentation

## 2013-01-17 DIAGNOSIS — J984 Other disorders of lung: Secondary | ICD-10-CM | POA: Insufficient documentation

## 2013-01-18 ENCOUNTER — Encounter: Payer: Self-pay | Admitting: Internal Medicine

## 2013-01-18 ENCOUNTER — Encounter: Payer: Self-pay | Admitting: Family Medicine

## 2013-01-19 ENCOUNTER — Encounter: Payer: Self-pay | Admitting: Internal Medicine

## 2013-01-19 ENCOUNTER — Telehealth: Payer: Self-pay | Admitting: Internal Medicine

## 2013-01-19 DIAGNOSIS — L80 Vitiligo: Secondary | ICD-10-CM | POA: Insufficient documentation

## 2013-01-19 NOTE — Telephone Encounter (Signed)
[name] would like to see a celiac and/or autoimmune specialist given her hx of lymphoma, autoimmune disorders and celiac disease. I will see who i can find that fits the bill, if possible.  She is ok to use NSAID's and her DEXA is NL.

## 2013-01-19 NOTE — Progress Notes (Signed)
Quick Note:  Bone density is Normal ______

## 2013-01-25 ENCOUNTER — Other Ambulatory Visit: Payer: Self-pay | Admitting: Family Medicine

## 2013-01-25 DIAGNOSIS — M546 Pain in thoracic spine: Secondary | ICD-10-CM

## 2013-01-25 NOTE — Progress Notes (Signed)
The patient has been having significant mid thoracic back pain. She has a hemangioma but this is not felt to be the reason for her pain. Referral to physical medicine specialist for second opinion regarding her pain and the most appropriate treatment of it.

## 2013-02-02 ENCOUNTER — Encounter: Payer: Self-pay | Admitting: *Deleted

## 2013-02-02 ENCOUNTER — Encounter: Payer: 59 | Attending: Internal Medicine | Admitting: *Deleted

## 2013-02-02 VITALS — Ht 63.5 in | Wt 149.7 lb

## 2013-02-02 DIAGNOSIS — Z713 Dietary counseling and surveillance: Secondary | ICD-10-CM | POA: Insufficient documentation

## 2013-02-02 DIAGNOSIS — K900 Celiac disease: Secondary | ICD-10-CM | POA: Insufficient documentation

## 2013-02-02 NOTE — Progress Notes (Signed)
  Medical Nutrition Therapy:  Appt start time: 8616 end time:  1715.  Assessment:  Primary concerns today: here for celiac disease, diagnosed in October, 2014. She has done some research on her own, stating she is getting confused with material she is reading about. She is at home during the day, lives with husband and 47 yo son. Husband does the grocery shopping and they share the meal preparation. She tries to increase activity level by walking outside @ 3 days a week.   Preferred Learning Style:  No preference indicated   Learning Readiness:   Ready  Change in progress  MEDICATIONS: see list   DIETARY INTAKE:  24-hr recall:  B ( AM): left over from dinner OR eggs and bacon, coffee with Stevia and cream, and water  Snk ( AM): occasionally nuts or fresh fruit, or gluten free chips and cream cheese  L ( PM): home made soup or left overs from dinner, water or almond chocolate milk Snk ( PM): same as AM D ( PM): home made soup OR meat with vegetables, rice or sweet potato, water Snk ( PM): occasionally, gluten free popcorn or chips Beverages: coffee, water, chocolate almond milk, Sun Drop  Usual physical activity: walking 3 days a week    Intervention:  Nutrition counseling for Celiac Disease provided. Commended her on her success in avoiding gluten containing foods and in locating places to buy gluten free foods. Provided additional written materials and resources.   Teaching Method Utilized:  Visual Auditory  Handouts given during visit include:  Gluten Free Diet: An Update for Health Professionals  Barriers to learning/adherence to lifestyle change: none, she is doing well  Demonstrated degree of understanding via:  Teach Back   Monitoring/Evaluation:  Dietary intake, exercise, reading food labels, and body weight prn.

## 2013-02-07 ENCOUNTER — Encounter: Payer: Self-pay | Admitting: Neurology

## 2013-02-07 ENCOUNTER — Ambulatory Visit (INDEPENDENT_AMBULATORY_CARE_PROVIDER_SITE_OTHER): Payer: 59 | Admitting: Neurology

## 2013-02-07 VITALS — BP 112/62 | HR 76 | Wt 150.0 lb

## 2013-02-07 DIAGNOSIS — R413 Other amnesia: Secondary | ICD-10-CM

## 2013-02-07 DIAGNOSIS — IMO0001 Reserved for inherently not codable concepts without codable children: Secondary | ICD-10-CM

## 2013-02-07 DIAGNOSIS — M797 Fibromyalgia: Secondary | ICD-10-CM

## 2013-02-07 HISTORY — DX: Other amnesia: R41.3

## 2013-02-07 MED ORDER — AMANTADINE HCL 100 MG PO CAPS: 100.0000 mg | ORAL_CAPSULE | Freq: Two times a day (BID) | ORAL | Status: DC

## 2013-02-07 NOTE — Progress Notes (Signed)
Reason for visit: Memory disturbance  [name] is an 47 y.o. female  History of present illness:  [name] is a 47 year old right-handed white female with a history of fibromyalgia, and celiac disease. The patient has had some issues for about 4 years, which parallels the diagnosis of her fibromyalgia. The patient reports an associated issue with fatigue. The patient is not sleeping well. The patient was seen in May of 2013, and she returns at this time for reevaluation. The patient had similar complaints on her last visit. The patient has had EMG and nerve conduction study evaluation that does not show evidence of a peripheral neuropathy or a myopathy. The patient has had low B12 levels, and she is on supplementation. The patient just began a gluten-free diet in October 2014. The patient also reports some dizziness with looking up, or rolling over in bed. The patient has no numbness of the extremities, but a slight gait imbalance has been noted. The patient also has a history of migraine headaches, and she recently has had some early morning headaches that are occurring almost daily. The patient reports that she has problems with short-term and long-term memory, difficulty with directions with driving, and problems with spelling with handwriting. The patient returns to the office today for an evaluation. MRI studies of the brain have been unremarkable.  Past Medical History  Diagnosis Date  . Iron deficiency anemia   . Depression   . Thyroid disease     hypothyroidism  . Low HDL (under 40)   . Fibromyalgia   . 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 Nila Nephew and a mini mantle setup presenting here for the first time on 05/21/2000, finishing chemotherapy as of 09/22/2000, then radiation.    . Folate deficiency 07/09/2012    Started on Folic Acid on 8/65/7846  .  Celiac disease   . Vitiligo   . Migraine   . Memory difficulty 02/07/2013    Past Surgical History  Procedure Laterality Date  . Back surgery      artificial disc in lumbar area  . Incontinence surgery    . Colonoscopy    . Breast biopsy      X 2 both benigh  . Tubal ligation    . Esophagogastroduodenoscopy      Family History  Problem Relation Age of Onset  . Cancer Maternal Aunt     breast cancer  . Cancer Maternal Grandmother     lung cancer  . Colon cancer Maternal Grandmother 48  . Liver cancer Maternal Grandfather   . Breast cancer Mother   . Uterine cancer Mother   . Cancer Mother     lung    Social history:  reports that she has never smoked. She has never used smokeless tobacco. She reports that she does not drink alcohol or use illicit drugs.    Allergies  Allergen Reactions  . Avelox [Moxifloxacin Hcl In Nacl] Nausea Only  . Codeine Nausea And Vomiting  . Wheat Bran Other (See Comments)    Damages intestines, due to Celiac disease    Medications:  Current Outpatient Prescriptions on File Prior to Visit  Medication Sig Dispense Refill  . ALPRAZolam (XANAX) 0.5 MG tablet TAKE 1/2 TO 1 TABLET BY MOUTH TWICE A DAY AS NEEDED  35 tablet  3  . beta carotene w/minerals (OCUVITE) tablet Take 1 tablet  by mouth daily.      . calcium carbonate (OS-CAL) 600 MG TABS Take 600 mg by mouth daily.      . Cholecalciferol 5000 UNITS capsule Take 15,000 Units by mouth daily.      . citalopram (CELEXA) 20 MG tablet take 1 tablet by mouth once daily  30 tablet  3  . cyanocobalamin 1000 MCG tablet Take 100 mcg by mouth daily.      . cyclobenzaprine (FLEXERIL) 10 MG tablet Take 10 mg by mouth 3 (three) times daily as needed for muscle spasms.      . ferrous sulfate 325 (65 FE) MG tablet Take 325 mg by mouth daily with breakfast.      . folic acid (FOLVITE) 1 MG tablet Take 1 mg by mouth daily.      Marland Kitchen ibuprofen (ADVIL,MOTRIN) 100 MG chewable tablet Chew 100 mg by mouth every 8  (eight) hours as needed.      Marland Kitchen levothyroxine (SYNTHROID, LEVOTHROID) 100 MCG tablet take 1 tablet by mouth once daily  30 tablet  3  . Melatonin 3 MG TABS Take 6 mg by mouth at bedtime.       Current Facility-Administered Medications on File Prior to Visit  Medication Dose Route Frequency Provider Last Rate Last Dose  . cyanocobalamin ((VITAMIN B-12)) injection 1,000 mcg  1,000 mcg Intramuscular Once Gatha Mayer, MD        ROS:  Out of a complete 14 system review of symptoms, the patient complains only of the following symptoms, and all other reviewed systems are negative.  Neck pain, neck stiffness Light sensitivity Palpitations of the heart Excessive thirst Abdominal pain, constipation, nausea Insomnia, frequent waking, daytime sleepiness Food allergies Frequent urination, urgency Joint pain, joint swelling, back pain, achy muscles Easy bruising, easy bleeding, anemia Memory loss, dizziness, numbness, weakness Confusion, depression, nervousness  Blood pressure 112/62, pulse 76, weight 150 lb (68.04 kg), last menstrual period 01/23/2013.  Physical Exam  General: The patient is alert and cooperative at the time of the examination.  Neuromuscular: Range of movement of the cervical spine is full.  Skin: No significant peripheral edema is noted.   Neurologic Exam  Mental status: The Mini-Mental status examination done today shows a total score of 30/30.  Cranial nerves: Facial symmetry is present. Speech is normal, no aphasia or dysarthria is noted. Extraocular movements are full. Visual fields are full.  Motor: The patient has good strength in all 4 extremities.  Sensory examination: Soft touch sensation on the face, arms, and legs is symmetric.  Coordination: The patient has good finger-nose-finger and heel-to-shin bilaterally.  Gait and station: The patient has a normal gait. Tandem gait is normal. Romberg is negative. No drift is seen.  Reflexes: Deep tendon  reflexes are symmetric.   Assessment/Plan:  1. Fibromyalgia  2. Chronic fatigue  3. Celiac disease  4. Migraine headache  5. Reported memory disorder  The patient appears to develop some troubles with concentration and memory around the time she began having fibromyalgia symptoms. The patient also reports chronic fatigue, and difficulty with concentration commonly occurs with fibromyalgia and the chronic fatigue syndrome. Celiac disease may result in a true dementia. The patient is now getting on a gluten-free diet. The patient will be placed on Symmetrel for the fatigue. In the past, the patient indicates that she was placed on Adderall, but she could not tolerate the medication secondary to increased anxiety and nervousness. Ritalin may be tried in the future. The patient  will followup in 6 months.  Jill Alexanders MD 02/07/2013 7:24 PM  [facility] [address] [place], [place] [address]  Phone [phone] Fax [phone]

## 2013-02-17 ENCOUNTER — Other Ambulatory Visit: Payer: 59

## 2013-02-17 ENCOUNTER — Encounter: Payer: Self-pay | Admitting: Internal Medicine

## 2013-02-17 ENCOUNTER — Ambulatory Visit (INDEPENDENT_AMBULATORY_CARE_PROVIDER_SITE_OTHER): Payer: 59 | Admitting: Internal Medicine

## 2013-02-17 VITALS — BP 96/60 | HR 88 | Ht 63.0 in | Wt 149.4 lb

## 2013-02-17 DIAGNOSIS — K900 Celiac disease: Secondary | ICD-10-CM

## 2013-02-17 NOTE — Assessment & Plan Note (Addendum)
Improved Could have some confusion problems from this - overall better Reassured checkTTG Ab's today and arrange f/u and then repeat EGD in 2015 15 minutes time spent discussing with her today

## 2013-02-17 NOTE — Progress Notes (Signed)
         Subjective:    Patient ID: [name], female    DOB: [birth year], 47 y.o.   MRN: [id number]  HPI [name] is feeling better with less abdominal symptoms on a gluten free diet. She has had some more energy. Met with dietitian. Has been concerned about bad complications of celiac disease but growing more comfortabl with it and working on diet. She has gained some weight. Wonders if confusion and concentration problems related to celiac disease.  Medications, allergies, past medical history, past surgical history, family history and social history are reviewed and updated in the EMR.' Review of Systems As above    Objective:   Physical Exam WDWN NAD    Assessment & Plan:  Celiac disease - Plan: Tissue transglutaminase, IgA

## 2013-02-17 NOTE — Patient Instructions (Addendum)
Your physician has requested that you go to the basement for the following lab work before leaving today: TTG  Follow up will  be decided after we get lab results.   I appreciate the opportunity to care for you.

## 2013-02-19 ENCOUNTER — Encounter: Payer: Self-pay | Admitting: Neurology

## 2013-02-21 ENCOUNTER — Other Ambulatory Visit: Payer: Self-pay

## 2013-02-21 DIAGNOSIS — K900 Celiac disease: Secondary | ICD-10-CM

## 2013-02-21 NOTE — Progress Notes (Signed)
Quick Note:  TTG Ab's much lower Needs TTG Ab again in 3 months and REV recall 6 months - please place order and I will tell her to come early March for the labs by My Chart ______

## 2013-03-07 NOTE — Telephone Encounter (Signed)
Pt had not read message, so I called and LM for her on her protected home #.  She is to call back if needed.

## 2013-04-13 ENCOUNTER — Other Ambulatory Visit: Payer: Self-pay | Admitting: Family Medicine

## 2013-04-21 ENCOUNTER — Ambulatory Visit (INDEPENDENT_AMBULATORY_CARE_PROVIDER_SITE_OTHER): Payer: 59 | Admitting: Family Medicine

## 2013-04-21 ENCOUNTER — Encounter: Payer: Self-pay | Admitting: Family Medicine

## 2013-04-21 VITALS — BP 102/60 | Temp 97.7°F | Ht 63.5 in | Wt 156.2 lb

## 2013-04-21 DIAGNOSIS — J019 Acute sinusitis, unspecified: Secondary | ICD-10-CM

## 2013-04-21 MED ORDER — CEFPROZIL 500 MG PO TABS: 500.0000 mg | ORAL_TABLET | Freq: Two times a day (BID) | ORAL | Status: AC

## 2013-04-21 NOTE — Progress Notes (Signed)
   Subjective:    Patient ID: [name], female    DOB: [birth year], 48 y.o.   MRN: [id number]  Sinusitis This is a new problem. The current episode started in the past 7 days. The problem is unchanged. There has been no fever. The pain is mild. Associated symptoms include congestion, coughing, headaches and sinus pressure. Pertinent negatives include no ear pain or shortness of breath. Past treatments include oral decongestants. The treatment provided no relief.   Started a few weeks ago then went into the chest, now just in the head She also has thyroid issues taking medication She has changed her diet since being diagnosed with celiac disease  Review of Systems  Constitutional: Negative for fever and activity change.  HENT: Positive for congestion, rhinorrhea and sinus pressure. Negative for ear pain.   Eyes: Negative for discharge.  Respiratory: Positive for cough. Negative for shortness of breath and wheezing.   Cardiovascular: Negative for chest pain.  Neurological: Positive for headaches.       Objective:   Physical Exam  Nursing note and vitals reviewed. Constitutional: She appears well-developed.  HENT:  Head: Normocephalic.  Nose: Nose normal.  Mouth/Throat: Oropharynx is clear and moist. No oropharyngeal exudate.  Neck: Neck supple.  Cardiovascular: Normal rate and normal heart sounds.   No murmur heard. Pulmonary/Chest: Effort normal and breath sounds normal. She has no wheezes.  Lymphadenopathy:    She has no cervical adenopathy.  Skin: Skin is warm and dry.          Assessment & Plan:  #1 sinusitis-antibiotics prescribed. If problems followup Cefzil prescribed twice a day for 2 weeks. Call if ongoing troubles  Celiac disease mild improvement with diet change. Follows up with specialist She will do thyroid testing later this year continue medication

## 2013-04-30 ENCOUNTER — Other Ambulatory Visit: Payer: Self-pay | Admitting: Family Medicine

## 2013-05-01 ENCOUNTER — Telehealth: Payer: Self-pay | Admitting: Family Medicine

## 2013-05-01 MED ORDER — ONDANSETRON 8 MG PO TBDP: 8.0000 mg | ORAL_TABLET | Freq: Three times a day (TID) | ORAL | Status: DC | PRN

## 2013-05-01 NOTE — Telephone Encounter (Signed)
Patient notified

## 2013-05-01 NOTE — Telephone Encounter (Signed)
Zofran 8 mg 1 3 times a day when necessary, #15 with one refill

## 2013-05-01 NOTE — Telephone Encounter (Signed)
Yes, patient would like Zofran please

## 2013-05-01 NOTE — Telephone Encounter (Signed)
At this point maybe viral , take antibiotic with food might help as well, if needing zofran we can do that

## 2013-05-01 NOTE — Telephone Encounter (Signed)
Was seen on 2/20 for sinusitis and prescribed Cefzil for 14 days

## 2013-05-01 NOTE — Telephone Encounter (Signed)
Patient says that she has been feeling nauseated for the last 3 day. She wants to know if you think its the antibiotic she may be taking due to sinusitis is causing this or maybe she has picked up a virus. She has not been vomiting, just burping a little bit.

## 2013-05-18 ENCOUNTER — Encounter: Payer: Self-pay | Admitting: Internal Medicine

## 2013-05-28 ENCOUNTER — Other Ambulatory Visit: Payer: Self-pay | Admitting: Family Medicine

## 2013-05-30 ENCOUNTER — Encounter: Payer: Self-pay | Admitting: Family Medicine

## 2013-05-30 ENCOUNTER — Encounter: Payer: Self-pay | Admitting: Internal Medicine

## 2013-06-20 ENCOUNTER — Ambulatory Visit (INDEPENDENT_AMBULATORY_CARE_PROVIDER_SITE_OTHER): Payer: 59

## 2013-06-20 DIAGNOSIS — H6120 Impacted cerumen, unspecified ear: Secondary | ICD-10-CM

## 2013-06-20 NOTE — Progress Notes (Signed)
   Subjective:    Patient ID: [name], female    DOB: Dec 16, 1965, 48 y.o.   MRN: [id number]  HPI Left ear irrigation completed without difficulty. Patient tolerated well.    Review of Systems     Objective:   Physical Exam        Assessment & Plan:

## 2013-06-21 ENCOUNTER — Ambulatory Visit (INDEPENDENT_AMBULATORY_CARE_PROVIDER_SITE_OTHER): Payer: 59 | Admitting: Family Medicine

## 2013-06-21 ENCOUNTER — Ambulatory Visit (HOSPITAL_COMMUNITY)
Admission: RE | Admit: 2013-06-21 | Discharge: 2013-06-21 | Disposition: A | Discharge status: Home / Self Care | Payer: 59 | Source: Ambulatory Visit | Attending: Family Medicine | Admitting: Family Medicine

## 2013-06-21 ENCOUNTER — Encounter: Payer: Self-pay | Admitting: Family Medicine

## 2013-06-21 VITALS — BP 110/60 | Ht 63.5 in | Wt 152.6 lb

## 2013-06-21 DIAGNOSIS — R739 Hyperglycemia, unspecified: Secondary | ICD-10-CM

## 2013-06-21 DIAGNOSIS — K900 Celiac disease: Secondary | ICD-10-CM

## 2013-06-21 DIAGNOSIS — M25559 Pain in unspecified hip: Secondary | ICD-10-CM | POA: Insufficient documentation

## 2013-06-21 DIAGNOSIS — M25552 Pain in left hip: Secondary | ICD-10-CM

## 2013-06-21 DIAGNOSIS — R7309 Other abnormal glucose: Secondary | ICD-10-CM

## 2013-06-21 DIAGNOSIS — R5383 Other fatigue: Secondary | ICD-10-CM

## 2013-06-21 DIAGNOSIS — IMO0001 Reserved for inherently not codable concepts without codable children: Secondary | ICD-10-CM

## 2013-06-21 DIAGNOSIS — E039 Hypothyroidism, unspecified: Secondary | ICD-10-CM

## 2013-06-21 DIAGNOSIS — R634 Abnormal weight loss: Secondary | ICD-10-CM

## 2013-06-21 DIAGNOSIS — M797 Fibromyalgia: Secondary | ICD-10-CM

## 2013-06-21 DIAGNOSIS — R5381 Other malaise: Secondary | ICD-10-CM

## 2013-06-21 DIAGNOSIS — D649 Anemia, unspecified: Secondary | ICD-10-CM

## 2013-06-21 MED ORDER — CITALOPRAM HYDROBROMIDE 40 MG PO TABS: ORAL_TABLET | ORAL | Status: DC

## 2013-06-21 MED ORDER — ALPRAZOLAM 0.5 MG PO TABS: ORAL_TABLET | ORAL | Status: DC

## 2013-06-21 NOTE — Progress Notes (Signed)
   Subjective:    Patient ID: [name], female    DOB: 1965-05-28, 48 y.o.   MRN: [id number]  HPI  Patient arrives to discuss fibromyalgia. Long discussion held with the patient. She is having troubles with fatigue tiredness feeling run down feeling depressed she's even had one episode where she felt like she wanted herself but then she states she's never had that since and denies any suicidal ideation currently and states she would never hurt herself because of her sons.  She does have a history of cancer but this is been many years ago.  She does relate that she is compliant with her medications She also has a history of celiac disease, she felt she was doing a pretty good job keep it under control  She also has fatigue and tiredness in addition to this has history of cholesterol and sugar issue she's trying eat healthy she also has several month history of left hip pain and discomfort with certain movements. Plus also a remote history of being anemic and fear of lupus. Review of Systems  Constitutional: Positive for fatigue and unexpected weight change (pt states losaing some weight). Negative for activity change and appetite change.  Respiratory: Negative for cough, choking and shortness of breath.   Cardiovascular: Negative for chest pain and leg swelling.  Endocrine: Negative for polydipsia and polyphagia.  Genitourinary: Negative for frequency.  Neurological: Positive for weakness.  Psychiatric/Behavioral: Positive for dysphoric mood. Negative for confusion.       Objective:   Physical Exam  Constitutional: She is oriented to person, place, and time. She appears well-developed and well-nourished.  HENT:  Head: Normocephalic.  Neck: No thyromegaly present.  Cardiovascular: Normal rate, regular rhythm, normal heart sounds and intact distal pulses.   No murmur heard. Pulmonary/Chest: Effort normal and breath sounds normal. No respiratory distress. She has no wheezes.    Abdominal: Soft. Bowel sounds are normal. She exhibits no distension and no mass. There is no tenderness.  Musculoskeletal: Normal range of motion. She exhibits no edema and no tenderness.  Lymphadenopathy:    She has no cervical adenopathy.  Neurological: She is alert and oriented to person, place, and time. She exhibits normal muscle tone.  Skin: Skin is warm and dry.  Psychiatric: She has a normal mood and affect. Her behavior is normal.          Assessment & Plan:  #1 fibromyalgia-patient had questions regarding Savella, this particular medication is very similar to Cymbalta she did not tolerate Cymbalta I would not recommend this at this time. Mild exercise healthy diet recommended we will be checking and some lab work to make sure there's not any other type of connective tissue disease going on #2 celiac disease patient is having some intermittent abdominal discomforts occasional loose stool occasional constipation doesn't know if it's due to her celiac disease she is trying to watch diet closely we will check lab work  #3 depression-I. do recommend increasing her medication from 20 mg 40 mg Celexa daily I recommend that this patient followup again in approximately 3-4 weeks if not doing well with this consider effexor #4 hypothyroidism check lab work  Significant amount time spent in patient greater than half in discussion of these issues

## 2013-06-28 LAB — CBC WITH DIFFERENTIAL/PLATELET
Basophils Absolute: 0.1 10*3/uL (ref 0.0–0.1)
Basophils Relative: 1 % (ref 0–1)
EOS ABS: 0.2 10*3/uL (ref 0.0–0.7)
Eosinophils Relative: 4 % (ref 0–5)
HEMATOCRIT: 38 % (ref 36.0–46.0)
HEMOGLOBIN: 12.8 g/dL (ref 12.0–15.0)
LYMPHS ABS: 1.7 10*3/uL (ref 0.7–4.0)
Lymphocytes Relative: 27 % (ref 12–46)
MCH: 30.6 pg (ref 26.0–34.0)
MCHC: 33.7 g/dL (ref 30.0–36.0)
MCV: 90.9 fL (ref 78.0–100.0)
MONO ABS: 0.8 10*3/uL (ref 0.1–1.0)
MONOS PCT: 13 % — AB (ref 3–12)
Neutro Abs: 3.4 10*3/uL (ref 1.7–7.7)
Neutrophils Relative %: 55 % (ref 43–77)
Platelets: 450 10*3/uL — ABNORMAL HIGH (ref 150–400)
RBC: 4.18 MIL/uL (ref 3.87–5.11)
RDW: 14.2 % (ref 11.5–15.5)
WBC: 6.2 10*3/uL (ref 4.0–10.5)

## 2013-06-28 LAB — BASIC METABOLIC PANEL
BUN: 11 mg/dL (ref 6–23)
CO2: 29 meq/L (ref 19–32)
Calcium: 9.1 mg/dL (ref 8.4–10.5)
Chloride: 106 mEq/L (ref 96–112)
Creat: 0.66 mg/dL (ref 0.50–1.10)
Glucose, Bld: 92 mg/dL (ref 70–99)
POTASSIUM: 4.9 meq/L (ref 3.5–5.3)
SODIUM: 140 meq/L (ref 135–145)

## 2013-06-28 LAB — LIPID PANEL
Cholesterol: 145 mg/dL (ref 0–200)
HDL: 52 mg/dL (ref >39)
LDL Cholesterol: 84 mg/dL (ref 0–99)
TRIGLYCERIDES: 43 mg/dL (ref <150)
Total CHOL/HDL Ratio: 2.8 Ratio
VLDL: 9 mg/dL (ref 0–40)

## 2013-06-28 LAB — TISSUE TRANSGLUTAMINASE, IGA: TISSUE TRANSGLUTAMINASE AB, IGA: 12 U/mL (ref <20)

## 2013-06-28 LAB — TSH: TSH: 0.182 u[IU]/mL — ABNORMAL LOW (ref 0.350–4.500)

## 2013-06-28 LAB — VITAMIN B12: Vitamin B-12: 504 pg/mL (ref 211–911)

## 2013-06-28 LAB — T4, FREE: Free T4: 1.2 ng/dL (ref 0.80–1.80)

## 2013-06-28 LAB — ANA: Anti Nuclear Antibody(ANA): NEGATIVE

## 2013-06-28 LAB — SEDIMENTATION RATE: Sed Rate: 1 mm/hr (ref 0–22)

## 2013-06-28 LAB — HEMOGLOBIN A1C
Hgb A1c MFr Bld: 5.7 % — ABNORMAL HIGH (ref <5.7)
MEAN PLASMA GLUCOSE: 117 mg/dL — AB (ref <117)

## 2013-07-02 ENCOUNTER — Other Ambulatory Visit: Payer: Self-pay | Admitting: Family Medicine

## 2013-07-03 NOTE — Telephone Encounter (Signed)
Ok to refill this plus 5 refills

## 2013-07-04 ENCOUNTER — Encounter: Payer: Self-pay | Admitting: Internal Medicine

## 2013-07-04 ENCOUNTER — Telehealth: Payer: Self-pay | Admitting: Family Medicine

## 2013-07-04 NOTE — Telephone Encounter (Signed)
ALPRAZolam (XANAX) 0.5 MG tablet  Pt states we have not sent this in from her last visit   [place] seen 06/21/13 Last filled 07/03/13 according to our records we printed and sent this yesterday but they say they don't have it

## 2013-07-04 NOTE — Telephone Encounter (Signed)
[facility]ed [facility]. Pt notified.

## 2013-07-13 ENCOUNTER — Encounter: Payer: Self-pay | Admitting: Family Medicine

## 2013-07-13 ENCOUNTER — Ambulatory Visit (INDEPENDENT_AMBULATORY_CARE_PROVIDER_SITE_OTHER): Payer: 59 | Admitting: Family Medicine

## 2013-07-13 VITALS — BP 102/62 | Ht 63.5 in | Wt 149.4 lb

## 2013-07-13 DIAGNOSIS — M898X9 Other specified disorders of bone, unspecified site: Secondary | ICD-10-CM

## 2013-07-13 DIAGNOSIS — F32A Depression, unspecified: Secondary | ICD-10-CM

## 2013-07-13 DIAGNOSIS — M25559 Pain in unspecified hip: Secondary | ICD-10-CM

## 2013-07-13 DIAGNOSIS — M949 Disorder of cartilage, unspecified: Secondary | ICD-10-CM

## 2013-07-13 DIAGNOSIS — R251 Tremor, unspecified: Secondary | ICD-10-CM

## 2013-07-13 DIAGNOSIS — IMO0001 Reserved for inherently not codable concepts without codable children: Secondary | ICD-10-CM

## 2013-07-13 DIAGNOSIS — M25552 Pain in left hip: Secondary | ICD-10-CM

## 2013-07-13 DIAGNOSIS — E039 Hypothyroidism, unspecified: Secondary | ICD-10-CM

## 2013-07-13 DIAGNOSIS — R259 Unspecified abnormal involuntary movements: Secondary | ICD-10-CM

## 2013-07-13 DIAGNOSIS — F3289 Other specified depressive episodes: Secondary | ICD-10-CM

## 2013-07-13 DIAGNOSIS — M899 Disorder of bone, unspecified: Secondary | ICD-10-CM

## 2013-07-13 DIAGNOSIS — F329 Major depressive disorder, single episode, unspecified: Secondary | ICD-10-CM

## 2013-07-13 LAB — ALKALINE PHOSPHATASE: ALK PHOS: 43 U/L (ref 39–117)

## 2013-07-13 NOTE — Progress Notes (Signed)
   Subjective:    Patient ID: [name], female    DOB: 1965-12-01, 48 y.o.   MRN: [id number]  HPI Patient is here today for a follow up visit on depression. Currently takes Celexa 40 mg daily. Patient states that the increase in the Celexa is helping her symptoms.   Patient states she has tremors in her hands. It has been present for several months now. Wants to discuss possible referral for this.worse with holding items/cups. No familt hx of tremors/parkinsons  Patient states she has been experiencing bone pain. This has been present for several days now. C/o deep ache in arms and legs.constant. No triggers  Still has lack of energy  Motivation a little better  Review of Systems     Objective:   Physical Exam  Vitals reviewed. Constitutional: She appears well-nourished. No distress.  Cardiovascular: Normal rate, regular rhythm and normal heart sounds.   No murmur heard. Pulmonary/Chest: Effort normal and breath sounds normal. No respiratory distress.  Musculoskeletal: She exhibits no edema.  Lymphadenopathy:    She has no cervical adenopathy.  Neurological: She is alert. She exhibits normal muscle tone.  Psychiatric: Her behavior is normal.   Patient does have tremors she cannot hold an item without tremors worse when she tries to pick up a small item. No cogwheeling no shuffling       Assessment & Plan:  1. Hip pain, left Patient with hip pain x-ray was negative I told her if he gets worse we would refer her to a specialist patient will contemplate let us know  2. Tremor Tremors seem to be getting worse over the past year I told her it's best to get her thyroid under control before seeing a neurologist but this could be benign essential tremor or could be a sign of another underlying problem she wonders if there is some sort of medical issue going on with her that could be causing all of these issues that she is having the fibromyalgia not feeling well thyroid I told  her I did not know of any. She is already seen a rheumatologist. Recent testing negative for lupus - CK total and CKMB (cardiac) - Alkaline phosphatase  3. Myalgia and myositis She complains of muscle soreness and discomfort we will go ahead and do CK - CK total and CKMB (cardiac) - Alkaline phosphatase  4. Bone pain She complains a deep ache we will check alkaline phosphatase to make sure that there is no bone involvement - CK total and CKMB (cardiac) - Alkaline phosphatase  5. Unspecified hypothyroidism She is currently seen a specialist for her thyroid I advised her to get her medicine adjusted because her TSH was low and this could be contributing to her tremor and also puts her at risk for osteoporosis  6. Depression Depression is actually doing better on the current medication continue the higher dose followup if ongoing troubles

## 2013-07-14 LAB — CK TOTAL AND CKMB (NOT AT ARMC)
CK, MB: <0.7 ng/mL (ref 0.0–5.0)
Total CK: 60 U/L (ref 7–177)

## 2013-08-08 ENCOUNTER — Ambulatory Visit (INDEPENDENT_AMBULATORY_CARE_PROVIDER_SITE_OTHER): Payer: 59 | Admitting: Adult Health

## 2013-08-08 ENCOUNTER — Encounter: Payer: Self-pay | Admitting: Adult Health

## 2013-08-08 VITALS — BP 113/78 | HR 72 | Ht 63.25 in | Wt 149.0 lb

## 2013-08-08 DIAGNOSIS — R251 Tremor, unspecified: Secondary | ICD-10-CM | POA: Insufficient documentation

## 2013-08-08 DIAGNOSIS — IMO0001 Reserved for inherently not codable concepts without codable children: Secondary | ICD-10-CM

## 2013-08-08 DIAGNOSIS — R413 Other amnesia: Secondary | ICD-10-CM

## 2013-08-08 DIAGNOSIS — M797 Fibromyalgia: Secondary | ICD-10-CM

## 2013-08-08 DIAGNOSIS — R259 Unspecified abnormal involuntary movements: Secondary | ICD-10-CM

## 2013-08-08 NOTE — Patient Instructions (Signed)
Fibromyalgia Fibromyalgia is a disorder that is often misunderstood. It is associated with muscular pains and tenderness that comes and goes. It is often associated with fatigue and sleep disturbances. Though it tends to be long-lasting, fibromyalgia is not life-threatening. CAUSES  The exact cause of fibromyalgia is unknown. People with certain gene types are predisposed to developing fibromyalgia and other conditions. Certain factors can play a role as triggers, such as:  Spine disorders.  Arthritis.  Severe injury (trauma) and other physical stressors.  Emotional stressors. SYMPTOMS   The main symptom is pain and stiffness in the muscles and joints, which can vary over time.  Sleep and fatigue problems. Other related symptoms may include:  Bowel and bladder problems.  Headaches.  Visual problems.  Problems with odors and noises.  Depression or mood changes.  Painful periods (dysmenorrhea).  Dryness of the skin or eyes. DIAGNOSIS  There are no specific tests for diagnosing fibromyalgia. Patients can be diagnosed accurately from the specific symptoms they have. The diagnosis is made by determining that nothing else is causing the problems. TREATMENT  There is no cure. Management includes medicines and an active, healthy lifestyle. The goal is to enhance physical fitness, decrease pain, and improve sleep. HOME CARE INSTRUCTIONS   Only take over-the-counter or prescription medicines as directed by your caregiver. Sleeping pills, tranquilizers, and pain medicines may make your problems worse.  Low-impact aerobic exercise is very important and advised for treatment. At first, it may seem to make pain worse. Gradually increasing your tolerance will overcome this feeling.  Learning relaxation techniques and how to control stress will help you. Biofeedback, visual imagery, hypnosis, muscle relaxation, yoga, and meditation are all options.  Anti-inflammatory medicines and  physical therapy may provide short-term help.  Acupuncture or massage treatments may help.  Take muscle relaxant medicines as suggested by your caregiver.  Avoid stressful situations.  Plan a healthy lifestyle. This includes your diet, sleep, rest, exercise, and friends.  Find and practice a hobby you enjoy.  Join a fibromyalgia support group for interaction, ideas, and sharing advice. This may be helpful. SEEK MEDICAL CARE IF:  You are not having good results or improvement from your treatment. FOR MORE INFORMATION  National Fibromyalgia Association: www.fmaware.[place]: www.arthritis.org Document Released: 02/16/2005 Document Revised: 05/11/2011 Document Reviewed: 05/29/2009 [facility] Patient Information 2014 [place], [place].

## 2013-08-08 NOTE — Progress Notes (Signed)
PATIENT: [name] DOB: 05-11-1965  REASON FOR VISIT: follow up HISTORY FROM: patient  HISTORY OF PRESENT ILLNESS: [name] is a 48 year old right-handed white female with a history of fibromyalgia, and celiac disease. Symmetrel was ordered last visit for fatigue but the patient did not notice a difference so she stopped taking it as the recommendation of her family doctor. Patient states that the fatigue remains the same. She has been exercising daily and hopes that this will eventually help her fatigue. She recently started a gluten-free diet. Patient states that fibromyalgia is a constant issue but states that some days or worse than others. Patient states that since she started exercising she has been trying to stretch regularly.Patient has tried cymbalta in the past but did not notice any benefit. Patient has also tried adderall but it made her feel anxious all the time. Patient was recently told she was prediabetic,so again she has adjusted her diet to help prevent the development of diabetes. Patient also states that she has a tremor in her hands that has been there a while. She states that the right hand is worse than the left. Patient states that she notices it when trying to eat, hold a cup and putting on makeup. Denies having a resting tremor. Patient is unaware of any family history of a tremor. No new medical issues since the last visit.   REVIEW OF SYSTEMS: Full 14 system review of systems performed and notable only for:  Constitutional: fatigue  Eyes: light sensitivity, blurred vision  Ear/Nose/Throat: N/A  Skin: N/A  Cardiovascular: N/A  Respiratory: cough  Gastrointestinal: constipation, diarrhea, nausea Genitourinary: incontinence of bladder, frequency of urination, urgency  Hematology/Lymphatic: N/A  Endocrine: N/A Musculoskeletal:joint pain, back pain, aching muscles, neck pain, neck stiffness Allergy/Immunology: food allergies Neurological: memory loss,  dizziness, numbness, weakness, tremors Psychiatric: confusion, depression, nervous/anxious, suicidal thoughts Sleep: insomnia, frequent waking, daytime sleepiness   ALLERGIES: Allergies  Allergen Reactions  . Avelox [Moxifloxacin Hcl In Nacl] Nausea Only  . Barley Grass   . Codeine Nausea And Vomiting  . Soy Allergy   . Wheat Bran Other (See Comments)    Damages intestines, due to Celiac disease, [place] and Barley    HOME MEDICATIONS: Outpatient Prescriptions Prior to Visit  Medication Sig Dispense Refill  . ALPRAZolam (XANAX) 0.5 MG tablet TAKE 1/2 TO 1 TABLET BY MOUTH TWICE A DAY AS NEEDED  35 tablet  5  . beta carotene w/minerals (OCUVITE) tablet Take 1 tablet by mouth daily.      . citalopram (CELEXA) 40 MG tablet take 1 tablet by mouth once daily  30 tablet  5  . ferrous sulfate 325 (65 FE) MG tablet Take 325 mg by mouth daily with breakfast.      . Melatonin 3 MG TABS Take 6 mg by mouth at bedtime.      . ondansetron (ZOFRAN-ODT) 8 MG disintegrating tablet Take 1 tablet (8 mg total) by mouth every 8 (eight) hours as needed for nausea or vomiting.  15 tablet  1  . levothyroxine (SYNTHROID, LEVOTHROID) 100 MCG tablet take 1 tablet by mouth once daily  30 tablet  3  . Thyroid (NATURE-THROID) 97.5 MG TABS Take by mouth daily.       Facility-Administered Medications Prior to Visit  Medication Dose Route Frequency Provider Last Rate Last Dose  . cyanocobalamin ((VITAMIN B-12)) injection 1,000 mcg  1,000 mcg Intramuscular Once Gatha Mayer, MD        PAST MEDICAL  HISTORY: Past Medical History  Diagnosis Date  . Iron deficiency anemia   . Depression   . Thyroid disease     hypothyroidism  . Low HDL (under 40)   . Fibromyalgia   . 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 Nila Nephew and a mini mantle setup presenting here for the first time on 05/21/2000,  finishing chemotherapy as of 09/22/2000, then radiation.    . Folate deficiency 07/09/2012    Started on Folic Acid on 8/56/3149  . Celiac disease   . Vitiligo   . Migraine   . Memory difficulty 02/07/2013    PAST SURGICAL HISTORY: Past Surgical History  Procedure Laterality Date  . Back surgery      artificial disc in lumbar area  . Incontinence surgery    . Colonoscopy    . Breast biopsy      X 2 both benigh  . Tubal ligation    . Esophagogastroduodenoscopy      FAMILY HISTORY: Family History  Problem Relation Age of Onset  . Cancer Maternal Aunt     breast cancer  . Cancer Maternal Grandmother     lung cancer  . Colon cancer Maternal Grandmother 70  . Liver cancer Maternal Grandfather   . Breast cancer Mother   . Uterine cancer Mother   . Lung cancer Mother     SOCIAL HISTORY: History   Social History  . Marital Status: Married    Spouse Name: Morris    Number of Children: 2  . Years of Education: 12   Occupational History  .     Social History Main Topics  . Smoking status: Never Smoker   . Smokeless tobacco: Never Used  . Alcohol Use: Yes     Comment: rarely-wine  . Drug Use: No  . Sexual Activity: Yes   Other Topics Concern  . Not on file   Social History Narrative   Patient is married (Morris) and lives at home with her husband and her son.   Patient has two children.   Patient is currently not working.   Patient has a high school education.   Patient is right-handed.   Patient drinks two cups of coffee daily.      PHYSICAL EXAM  Filed Vitals:   08/08/13 1146  BP: 113/78  Pulse: 72  Height: 5' 3.25" (1.607 m)  Weight: 149 lb (67.586 kg)   Body mass index is 26.17 kg/(m^2).  Generalized: Well developed, in no acute distress   Neurological examination  Mentation: Alert oriented to time, place, history taking. Follows all commands speech and language fluent. MMSE 29/30 Cranial nerve II-XII: . Extraocular movements were full,  visual field were full on confrontational test.  Motor: The motor testing reveals 5 over 5 strength of all 4 extremities. Good symmetric motor tone is noted throughout.  Sensory: Sensory testing is intact to soft touch on all 4 extremities. No evidence of extinction is noted.  Coordination: Cerebellar testing reveals good finger-nose-finger and heel-to-shin bilaterally. Intention tremor in bilateral hands the right worse than the left. Gait and station: Gait is normal. Tandem gait is normal. Romberg is negative. No drift is seen.  Reflexes: Deep tendon reflexes are symmetric and normal bilaterally.  Marland Kitchen   DIAGNOSTIC DATA (LABS, IMAGING, TESTING) - I reviewed patient records, labs, notes, testing and imaging myself where available.  Lab Results  Component Value  Date   WBC 6.2 06/27/2013   HGB 12.8 06/27/2013   HCT 38.0 06/27/2013   MCV 90.9 06/27/2013   PLT 450* 06/27/2013      Component Value Date/Time   NA 140 06/27/2013 1139   K 4.9 06/27/2013 1139   CL 106 06/27/2013 1139   CO2 29 06/27/2013 1139   GLUCOSE 92 06/27/2013 1139   BUN 11 06/27/2013 1139   CREATININE 0.66 06/27/2013 1139   CREATININE 0.79 12/27/2012 1207   CALCIUM 9.1 06/27/2013 1139   PROT 7.3 12/27/2012 1207   ALBUMIN 3.9 12/27/2012 1207   AST 31 12/27/2012 1207   ALT 27 12/27/2012 1207   ALKPHOS 43 07/13/2013 1112   BILITOT 0.4 12/27/2012 1207   GFRNONAA >90 12/27/2012 1207   GFRAA >90 12/27/2012 1207   Lab Results  Component Value Date   CHOL 145 06/27/2013   HDL 52 06/27/2013   LDLCALC 84 06/27/2013   TRIG 43 06/27/2013   CHOLHDL 2.8 06/27/2013   Lab Results  Component Value Date   HGBA1C 5.7* 06/27/2013   Lab Results  Component Value Date   VITAMINB12 504 06/27/2013   Lab Results  Component Value Date   TSH 0.182* 06/27/2013      ASSESSMENT AND PLAN 48 y.o. year old female  has a past medical history of Iron deficiency anemia; Depression; Thyroid disease; Low HDL (under 40); Fibromyalgia; NHL (non-Hodgkin's  lymphoma) (07/08/2012); Folate deficiency (07/09/2012); Celiac disease; Vitiligo; Migraine; and Memory difficulty (02/07/2013). here with:   1. Fibromyalgia 2. Memory difficulty 3. Tremor  Patient states that memory and fibromyalgia has remained the same. States that she has good days and bad days. Patient had an MRI of brain in 2013 that was normal. Patient feels that her fatigue has remained the same as well. Has tried adderall, symmetrel, cymbalta in the past with no success. At this time patient does not want to try medication. She is trying to exercise daily and adjust her diet. She has even considered trying herbal supplements to help with her fatigue. Patient also has an intention tremor, this could be related to her thyroid. She recently had levels draw and her thyroid medication was adjusted. Patient advised that if her tremor gets worse or is disrupting her daily activities, to let us know and we can try medication. Patient should follow-up in 6 months or sooner if needed.    Ward Givens, MSN, NP-C 08/08/2013, 11:56 AM [facility] [address], [place], [place] [address] [phone]  Note: This document was prepared with digital dictation and possible smart phrase technology. Any transcriptional errors that result from this process are unintentional.

## 2013-08-08 NOTE — Progress Notes (Signed)
I have read the note, and I agree with the clinical assessment and plan.  Liliauna Santoni K Raynaldo Falco   

## 2013-09-26 ENCOUNTER — Telehealth: Payer: Self-pay | Admitting: Family Medicine

## 2013-09-26 ENCOUNTER — Other Ambulatory Visit: Payer: Self-pay | Admitting: Family Medicine

## 2013-09-26 DIAGNOSIS — N6320 Unspecified lump in the left breast, unspecified quadrant: Secondary | ICD-10-CM

## 2013-09-26 NOTE — Telephone Encounter (Signed)
Scheduled dx mammo at [facility] for Mon Aug 3 at 1:30pm. Pt notified and verbalized understanding. Pt will call back to schedule appt with Dr. Nicki Reaper to examine area and discuss after they do the mammo.

## 2013-09-26 NOTE — Telephone Encounter (Signed)
Patient says that she needs an order sent to the [facility] for a diagnostic mammogram for her left breast due to a lump she has that has gotten bigger and sore. She said this was found once before already, but needs to have checked.

## 2013-09-26 NOTE — Telephone Encounter (Signed)
With "lump larger and sore" go ahead and authorize br center referral and tell pt also to f u with [place] to examine the area and disc after they do the mammo

## 2013-09-26 NOTE — Telephone Encounter (Signed)
Last seen by Dr. Nicki Reaper on 07/13/13. I do not see where she has had an annual exam with [place] in over a year.  Last mammogram was 09/09/12

## 2013-09-29 ENCOUNTER — Other Ambulatory Visit: Payer: Self-pay | Admitting: Obstetrics and Gynecology

## 2013-10-02 ENCOUNTER — Ambulatory Visit
Admission: RE | Admit: 2013-10-02 | Discharge: 2013-10-02 | Disposition: A | Discharge status: Home / Self Care | Payer: 59 | Source: Ambulatory Visit | Attending: Family Medicine | Admitting: Family Medicine

## 2013-10-02 ENCOUNTER — Other Ambulatory Visit: Payer: Self-pay | Admitting: Family Medicine

## 2013-10-02 DIAGNOSIS — N6320 Unspecified lump in the left breast, unspecified quadrant: Secondary | ICD-10-CM

## 2013-10-11 ENCOUNTER — Encounter: Payer: Self-pay | Admitting: Gastroenterology

## 2013-10-30 ENCOUNTER — Encounter: Payer: Self-pay | Admitting: Internal Medicine

## 2013-10-30 ENCOUNTER — Ambulatory Visit (INDEPENDENT_AMBULATORY_CARE_PROVIDER_SITE_OTHER): Payer: 59 | Admitting: Internal Medicine

## 2013-10-30 VITALS — BP 110/56 | HR 88 | Ht 63.0 in | Wt 147.2 lb

## 2013-10-30 DIAGNOSIS — K900 Celiac disease: Secondary | ICD-10-CM

## 2013-10-30 DIAGNOSIS — K589 Irritable bowel syndrome without diarrhea: Secondary | ICD-10-CM | POA: Insufficient documentation

## 2013-10-30 DIAGNOSIS — R413 Other amnesia: Secondary | ICD-10-CM

## 2013-10-30 NOTE — Assessment & Plan Note (Signed)
Urgent defecation probably from this and not celiac If EGd and duod bxs ok and TTG Ab ok - ? Capsule small bowel endoscopy

## 2013-10-30 NOTE — Patient Instructions (Signed)
You have been scheduled for an endoscopy. Please follow written instructions given to you at your visit today. If you use inhalers (even only as needed), please bring them with you on the day of your procedure. Your physician has requested that you go to www.startemmi.com and enter the access code given to you at your visit today. This web site gives a general overview about your procedure. However, you should still follow specific instructions given to you by our office regarding your preparation for the procedure.  We will not do lab work today since your having it done soon with Dr. Wolfgang Phoenix.  I appreciate the opportunity to care for you.

## 2013-10-30 NOTE — Assessment & Plan Note (Addendum)
Discussed asking about more sophisticated testing (neuropsych/cognitition) Do not think related to celiac disease especially since she has NL TTG Ab Does not sem to be a common issue post CHOP a whole host of vitamin levels were checked when the diagnosis of celiac disease is found that she was really a

## 2013-10-30 NOTE — Assessment & Plan Note (Addendum)
Repeat EGD now that Ab's ok (in April) Recheck TTG Ab with Dr. Wolfgang Phoenix this week The risks and benefits as well as alternatives of endoscopic procedure(s) have been discussed and reviewed. All questions answered. The patient agrees to proceed.

## 2013-10-30 NOTE — Progress Notes (Signed)
   Subjective:    Patient ID: [name], female    DOB: 1965-03-20, 48 y.o.   MRN: [id number]  HPI [name] is here for followup. As per the chief complaint she has a multitude of problems most worrisome to her is her memory disturbance. As far as GI history she is still having daily lower abdominal cramping an urgent defecation. As long as she is home that she usually not a problem. In April her tissue transglutaminase antibody IgA was below the threshold so she has done a good job will remain gluten, unfortunately she still has a lot of aches and pains in the memory disturbance which nobody can sort out.  Medications, allergies, past medical history, past surgical history, family history and social history are reviewed and updated in the EMR.  Review of Systems As above, she has been having her thyroid replacement levels adjusted    Objective:   Physical Exam General:  NAD Eyes:   anicteric Lungs:  clear Heart:  S1S2 no rubs, murmurs or gallops Abdomen:  soft and nontender, BS+  Data Reviewed:  Neurology notes labs filed in the EMR primary care notes    Assessment & Plan:  Celiac disease Repeat EGD now that Ab's ok (in April) Recheck TTG Ab with Dr. Wolfgang Phoenix this week The risks and benefits as well as alternatives of endoscopic procedure(s) have been discussed and reviewed. All questions answered. The patient agrees to proceed.    Memory difficulty Discussed asking about more sophisticated testing (neuropsych/cognitition) Do not think related to celiac disease especially since she has NL TTG Ab Does not sem to be a common issue post CHOP a whole host of vitamin levels were checked when the diagnosis of celiac disease is found that she was really a  IBS (irritable bowel syndrome) Urgent defecation probably from this and not celiac If EGd and duod bxs ok and TTG Ab ok - ? Capsule small bowel endoscopy   I appreciate the opportunity to care for this patient. CC: [name],  MD

## 2013-11-01 ENCOUNTER — Encounter: Payer: Self-pay | Admitting: Internal Medicine

## 2013-11-03 ENCOUNTER — Ambulatory Visit: Payer: 59 | Admitting: Family Medicine

## 2013-11-08 DIAGNOSIS — Z0289 Encounter for other administrative examinations: Secondary | ICD-10-CM

## 2013-11-09 ENCOUNTER — Ambulatory Visit (INDEPENDENT_AMBULATORY_CARE_PROVIDER_SITE_OTHER): Payer: 59 | Admitting: Family Medicine

## 2013-11-09 ENCOUNTER — Encounter: Payer: Self-pay | Admitting: Family Medicine

## 2013-11-09 VITALS — BP 120/72 | Ht 63.4 in | Wt 145.8 lb

## 2013-11-09 DIAGNOSIS — K900 Celiac disease: Secondary | ICD-10-CM

## 2013-11-09 DIAGNOSIS — R413 Other amnesia: Secondary | ICD-10-CM

## 2013-11-09 DIAGNOSIS — D509 Iron deficiency anemia, unspecified: Secondary | ICD-10-CM

## 2013-11-09 DIAGNOSIS — R259 Unspecified abnormal involuntary movements: Secondary | ICD-10-CM

## 2013-11-09 DIAGNOSIS — R251 Tremor, unspecified: Secondary | ICD-10-CM

## 2013-11-09 DIAGNOSIS — E039 Hypothyroidism, unspecified: Secondary | ICD-10-CM

## 2013-11-09 LAB — CBC WITH DIFFERENTIAL/PLATELET
BASOS ABS: 0.1 10*3/uL (ref 0.0–0.1)
Basophils Relative: 1 % (ref 0–1)
Eosinophils Absolute: 0.2 10*3/uL (ref 0.0–0.7)
Eosinophils Relative: 3 % (ref 0–5)
HEMATOCRIT: 39.3 % (ref 36.0–46.0)
Hemoglobin: 13.2 g/dL (ref 12.0–15.0)
Lymphocytes Relative: 27 % (ref 12–46)
Lymphs Abs: 1.8 10*3/uL (ref 0.7–4.0)
MCH: 30.8 pg (ref 26.0–34.0)
MCHC: 33.6 g/dL (ref 30.0–36.0)
MCV: 91.8 fL (ref 78.0–100.0)
MONO ABS: 1 10*3/uL (ref 0.1–1.0)
Monocytes Relative: 15 % — ABNORMAL HIGH (ref 3–12)
Neutro Abs: 3.5 10*3/uL (ref 1.7–7.7)
Neutrophils Relative %: 54 % (ref 43–77)
Platelets: 458 10*3/uL — ABNORMAL HIGH (ref 150–400)
RBC: 4.28 MIL/uL (ref 3.87–5.11)
RDW: 14.5 % (ref 11.5–15.5)
WBC: 6.5 10*3/uL (ref 4.0–10.5)

## 2013-11-09 LAB — HEMOGLOBIN A1C
Hgb A1c MFr Bld: 5.6 % (ref <5.7)
Mean Plasma Glucose: 114 mg/dL (ref <117)

## 2013-11-09 LAB — HEPATIC FUNCTION PANEL
ALT: 15 U/L (ref 0–35)
AST: 18 U/L (ref 0–37)
Albumin: 4.2 g/dL (ref 3.5–5.2)
Alkaline Phosphatase: 43 U/L (ref 39–117)
BILIRUBIN TOTAL: 0.7 mg/dL (ref 0.2–1.2)
Bilirubin, Direct: 0.1 mg/dL (ref 0.0–0.3)
Indirect Bilirubin: 0.6 mg/dL (ref 0.2–1.2)
Total Protein: 7 g/dL (ref 6.0–8.3)

## 2013-11-09 LAB — BASIC METABOLIC PANEL
BUN: 12 mg/dL (ref 6–23)
CALCIUM: 9.1 mg/dL (ref 8.4–10.5)
CO2: 28 mEq/L (ref 19–32)
Chloride: 103 mEq/L (ref 96–112)
Creat: 0.7 mg/dL (ref 0.50–1.10)
GLUCOSE: 91 mg/dL (ref 70–99)
Potassium: 4.6 mEq/L (ref 3.5–5.3)
SODIUM: 139 meq/L (ref 135–145)

## 2013-11-09 NOTE — Progress Notes (Signed)
   Subjective:    Patient ID: [name], female    DOB: Apr 10, 1965, 48 y.o.   MRN: [id number]  HPI  Patient arrives to discuss problems with tremors- always in hands sometimes in full body- worse in am.  Patient wonders if it is time for her thyroid testing- she has been very fatigued lately and wonders if thyroid is off. Long discussion held regarding her tremors, fibromyalgia, fatigue, memory dysfunction, the need for further evaluation Review of Systems Patient with significant tremor also with fatigue denies cough vomiting wheezing shortness of breath does have diarrhea several times a week    Objective:   Physical Exam  Lungs are clear heart regular has tremor noted worse with finger to nose. Romberg wafers extremities no edema      Assessment & Plan:  Mild tremor-patient's tremor gets worse with activity. Even no thyroid could contribute to it her numbers have looked good in the past we will recheck thyroid function  Cognitive dysfunction I believe this patient would benefit from seeing a neuropsychologist for a cognitive specialists [facility]  Celiac disease check lab work Will forward results to Dr. Carlean Purl also will do lab work cholesterol sugar kidney function liver patient is suffering with a lot of fatigue chart notes muscle aches or 25 minutes spent with patient [phone]

## 2013-11-10 LAB — TISSUE TRANSGLUTAMINASE, IGG: Tissue Transglut Ab: 34.2 U/mL — ABNORMAL HIGH (ref <20)

## 2013-11-10 LAB — TISSUE TRANSGLUTAMINASE, IGA: TISSUE TRANSGLUTAMINASE AB, IGA: 13.8 U/mL (ref <20)

## 2013-11-10 LAB — TSH: TSH: 3.422 u[IU]/mL (ref 0.350–4.500)

## 2013-11-10 LAB — T4, FREE: FREE T4: 0.99 ng/dL (ref 0.80–1.80)

## 2013-11-10 LAB — FERRITIN: Ferritin: 34 ng/mL (ref 10–291)

## 2013-11-13 ENCOUNTER — Telehealth: Payer: Self-pay | Admitting: Family Medicine

## 2013-11-13 NOTE — Telephone Encounter (Signed)
Pt wants results from her bw done on 11/09/13

## 2013-11-13 NOTE — Telephone Encounter (Signed)
Hemoglobin looks good are in storage just slightly low tissue transglutaminase IgA is negative this is good news tissue transglutaminase IgG slightly elevated I am not sure of the clinical significance. keep appointment with gastroenterology. Also keep appointment with oncology. We should recheck your thyroid function again in approximately 4 months. A copy of your lab work was sent to Dr. Carlean Purl. Thanks, Dr Nicki Reaper

## 2013-11-13 NOTE — Telephone Encounter (Signed)
Results discussed with patient(see lab results). Patient advised Hemoglobin looks good are Fe storage just slightly low tissue transglutaminase IgA is negative this is good news tissue transglutaminase IgG slightly elevated I am not sure of the clinical significance. keep appointment with gastroenterology. Also keep appointment with oncology. We should recheck your thyroid function again in approximately 4 months. A copy of your lab work was sent to Dr. Carlean Purl. Patient verbalized understanding.

## 2013-11-15 ENCOUNTER — Telehealth: Payer: Self-pay | Admitting: *Deleted

## 2013-11-15 NOTE — Telephone Encounter (Signed)
Notes fax on 11/15/13 to attn: [facility] office.

## 2013-11-16 ENCOUNTER — Telehealth: Payer: Self-pay | Admitting: Family Medicine

## 2013-11-16 ENCOUNTER — Encounter: Payer: Self-pay | Admitting: Family Medicine

## 2013-11-16 NOTE — Telephone Encounter (Signed)
Patient checking on referral to [facility].

## 2013-11-16 NOTE — Telephone Encounter (Signed)
Spoke with patient, referral done, mailed letter with info needed

## 2013-11-30 ENCOUNTER — Telehealth: Payer: Self-pay | Admitting: Family Medicine

## 2013-11-30 NOTE — Telephone Encounter (Signed)
Please have Dr. Nicki Reaper review paperwork on [facility] chart for her disability hearing. She states it is due tomorrow. Please forward to [place] when complete.

## 2013-11-30 NOTE — Telephone Encounter (Signed)
Pt notified form will be ready.  Fax to  [phone].

## 2013-12-05 NOTE — Telephone Encounter (Signed)
Form faxed by medical records

## 2013-12-26 ENCOUNTER — Ambulatory Visit (HOSPITAL_COMMUNITY): Payer: 59 | Admitting: Oncology

## 2013-12-26 ENCOUNTER — Ambulatory Visit (HOSPITAL_COMMUNITY): Payer: 59

## 2013-12-27 NOTE — Progress Notes (Signed)
This encounter was created in error - please disregard.

## 2014-01-03 ENCOUNTER — Encounter: Payer: 59 | Admitting: Internal Medicine

## 2014-01-22 ENCOUNTER — Other Ambulatory Visit: Payer: Self-pay | Admitting: Family Medicine

## 2014-01-23 NOTE — Telephone Encounter (Signed)
Name refill this +4 additional refills

## 2014-02-08 ENCOUNTER — Ambulatory Visit: Payer: 59 | Admitting: Adult Health

## 2014-02-28 ENCOUNTER — Encounter: Payer: Self-pay | Admitting: Family Medicine

## 2014-02-28 ENCOUNTER — Ambulatory Visit (INDEPENDENT_AMBULATORY_CARE_PROVIDER_SITE_OTHER): Payer: 59 | Admitting: Family Medicine

## 2014-02-28 VITALS — BP 118/68 | Temp 97.8°F | Ht 63.5 in | Wt 149.0 lb

## 2014-02-28 DIAGNOSIS — R5383 Other fatigue: Secondary | ICD-10-CM

## 2014-02-28 DIAGNOSIS — G43009 Migraine without aura, not intractable, without status migrainosus: Secondary | ICD-10-CM

## 2014-02-28 DIAGNOSIS — E039 Hypothyroidism, unspecified: Secondary | ICD-10-CM

## 2014-02-28 DIAGNOSIS — E538 Deficiency of other specified B group vitamins: Secondary | ICD-10-CM

## 2014-02-28 DIAGNOSIS — R55 Syncope and collapse: Secondary | ICD-10-CM

## 2014-02-28 DIAGNOSIS — K900 Celiac disease: Secondary | ICD-10-CM

## 2014-02-28 DIAGNOSIS — D509 Iron deficiency anemia, unspecified: Secondary | ICD-10-CM

## 2014-02-28 MED ORDER — SUMATRIPTAN SUCCINATE 50 MG PO TABS: ORAL_TABLET | ORAL | Status: DC

## 2014-02-28 MED ORDER — ONDANSETRON HCL 8 MG PO TABS: 8.0000 mg | ORAL_TABLET | Freq: Three times a day (TID) | ORAL | Status: DC | PRN

## 2014-02-28 NOTE — Progress Notes (Signed)
   Subjective:    Patient ID: [name], female    DOB: 04/04/1965, 48 y.o.   MRN: [id number]  Loss of Consciousness This is a new problem. The current episode started in the past 7 days. Associated symptoms include dizziness, headaches, nausea and vertigo. Treatments tried: immitrex, sinus med, ibuprofen. The treatment provided moderate relief.   patient states she's had some spells when she lays back the room spins on her. This is intermittent not consistent  She also states couple of times she stood up she been over to straighten her pants legs in she then stood back up and felt dizzy and passed out she states the passing out she could feel herself going downwards to the ground she doesn't feel she was out more than a couple seconds this is never happened in any other time but circumstances    Review of Systems  Cardiovascular: Positive for syncope.  Gastrointestinal: Positive for nausea.  Neurological: Positive for dizziness, vertigo and headaches.       Objective:   Physical Exam her lungs clear hearts regular pulse normal extremities no edema Blood pressure laying down was 110/70 sitting up was proximally 104/66 standing it was 90/60 Neurologic grossly normal     Assessment & Plan:  25 minutes spent with this patient working through a complex issue  Orthostatic hypotension-she is encouraged keeps up well hydrated in on a regular basis may use salt she was also counseled how to get up slowly and how to use leg muscles to improve her condition  Syncope related to orthostasis no sign of underlying cardiac disease  She has multiple other problems including anemia celiac disease B12 deficiency and hypothyroidism for which she once to get her lab tested  She also has been having an occasional migraine Imitrex was prescribed for that she had tried a friend's Imitrex and that helped. Patient relates that she will keep a headache calendar and send that back to [place].

## 2014-02-28 NOTE — Telephone Encounter (Signed)
error 

## 2014-03-01 LAB — CBC WITH DIFFERENTIAL/PLATELET
BASOS ABS: 0.1 10*3/uL (ref 0.0–0.1)
BASOS PCT: 1 % (ref 0–1)
Eosinophils Absolute: 0.2 10*3/uL (ref 0.0–0.7)
Eosinophils Relative: 3 % (ref 0–5)
HEMATOCRIT: 40.9 % (ref 36.0–46.0)
Hemoglobin: 13.7 g/dL (ref 12.0–15.0)
Lymphocytes Relative: 23 % (ref 12–46)
Lymphs Abs: 1.6 10*3/uL (ref 0.7–4.0)
MCH: 31.4 pg (ref 26.0–34.0)
MCHC: 33.5 g/dL (ref 30.0–36.0)
MCV: 93.6 fL (ref 78.0–100.0)
MPV: 10.6 fL (ref 8.6–12.4)
Monocytes Absolute: 1.2 10*3/uL — ABNORMAL HIGH (ref 0.1–1.0)
Monocytes Relative: 17 % — ABNORMAL HIGH (ref 3–12)
NEUTROS ABS: 3.9 10*3/uL (ref 1.7–7.7)
NEUTROS PCT: 56 % (ref 43–77)
PLATELETS: 443 10*3/uL — AB (ref 150–400)
RBC: 4.37 MIL/uL (ref 3.87–5.11)
RDW: 13.4 % (ref 11.5–15.5)
WBC: 6.9 10*3/uL (ref 4.0–10.5)

## 2014-03-01 LAB — TSH: TSH: 5.63 u[IU]/mL — AB (ref 0.350–4.500)

## 2014-03-01 LAB — TISSUE TRANSGLUTAMINASE, IGA: Tissue Transglutaminase Ab, IgA: 3 U/mL (ref <4)

## 2014-03-01 LAB — FERRITIN: FERRITIN: 63 ng/mL (ref 10–291)

## 2014-03-01 LAB — VITAMIN D 25 HYDROXY (VIT D DEFICIENCY, FRACTURES): VIT D 25 HYDROXY: 32 ng/mL (ref 30–100)

## 2014-03-01 LAB — VITAMIN B12: Vitamin B-12: 621 pg/mL (ref 211–911)

## 2014-03-01 LAB — T4, FREE: Free T4: 0.93 ng/dL (ref 0.80–1.80)

## 2014-03-12 MED ORDER — LEVOTHYROXINE SODIUM 25 MCG PO TABS: 25.0000 ug | ORAL_TABLET | Freq: Every day | ORAL | Status: DC

## 2014-03-12 NOTE — Addendum Note (Signed)
Addended by: Dairl Ponder on: 03/12/2014 04:15 PM   Modules accepted: Orders

## 2014-03-13 ENCOUNTER — Encounter: Payer: Self-pay | Admitting: Family Medicine

## 2014-03-20 ENCOUNTER — Other Ambulatory Visit: Payer: Self-pay | Admitting: *Deleted

## 2014-03-20 MED ORDER — LEVOTHYROXINE SODIUM 25 MCG PO TABS: 25.0000 ug | ORAL_TABLET | Freq: Every day | ORAL | Status: DC

## 2014-03-30 ENCOUNTER — Other Ambulatory Visit: Payer: Self-pay | Admitting: *Deleted

## 2014-03-30 ENCOUNTER — Encounter: Payer: Self-pay | Admitting: Family Medicine

## 2014-03-30 ENCOUNTER — Encounter: Payer: Self-pay | Admitting: *Deleted

## 2014-03-30 MED ORDER — ZOLPIDEM TARTRATE 5 MG PO TABS: 5.0000 mg | ORAL_TABLET | Freq: Every evening | ORAL | Status: DC | PRN

## 2014-03-30 NOTE — Telephone Encounter (Signed)
Please inform the patient I am fine with that her trying Ambien. I would recommend 5 mg Ambien daily at bedtime. This is now the standard dose for females. #30, 4 refills, when the patient takes medication she must devote at least 8 hours to sleep.

## 2014-05-08 ENCOUNTER — Other Ambulatory Visit: Payer: Self-pay | Admitting: Family Medicine

## 2014-07-19 ENCOUNTER — Other Ambulatory Visit: Payer: Self-pay | Admitting: Family Medicine

## 2014-07-19 ENCOUNTER — Telehealth: Payer: Self-pay | Admitting: Hematology

## 2014-07-19 ENCOUNTER — Telehealth: Payer: Self-pay | Admitting: Family Medicine

## 2014-07-19 DIAGNOSIS — C8590 Non-Hodgkin lymphoma, unspecified, unspecified site: Secondary | ICD-10-CM

## 2014-07-19 NOTE — Telephone Encounter (Signed)
Referral put in plz proceed ( pt being followed- stable-no recent reoccurence)

## 2014-07-19 NOTE — Telephone Encounter (Signed)
Pt left msg on my voicemail, would like referral to [facility], would like female physician that specializes in [place] Would like to switch to [place] because she now lives in [facility] Please initiate in system so that I may process

## 2014-07-19 NOTE — Telephone Encounter (Signed)
new patient referral-s/w patient and gave np appt for 06/02 @ 10:30 w/Dr. Burr Medico Referring Dr. [name] Dx- NHL

## 2014-08-01 ENCOUNTER — Telehealth: Payer: Self-pay | Admitting: Hematology and Oncology

## 2014-08-01 ENCOUNTER — Telehealth: Payer: Self-pay | Admitting: Hematology

## 2014-08-01 NOTE — Telephone Encounter (Signed)
Informed patient of provider change and that i will call back with appt.

## 2014-08-01 NOTE — Telephone Encounter (Signed)
new patient appt-s/w patient and gave np appt for 06/28 @ 1:30 w/Dr. Alvy Bimler

## 2014-08-02 ENCOUNTER — Ambulatory Visit: Payer: Self-pay | Admitting: Hematology

## 2014-08-02 ENCOUNTER — Ambulatory Visit: Payer: Self-pay

## 2014-08-10 ENCOUNTER — Other Ambulatory Visit: Payer: Self-pay | Admitting: Family Medicine

## 2014-08-28 ENCOUNTER — Ambulatory Visit (HOSPITAL_BASED_OUTPATIENT_CLINIC_OR_DEPARTMENT_OTHER): Payer: Commercial Managed Care - HMO

## 2014-08-28 ENCOUNTER — Encounter: Payer: Self-pay | Admitting: Hematology and Oncology

## 2014-08-28 ENCOUNTER — Ambulatory Visit: Payer: Self-pay

## 2014-08-28 ENCOUNTER — Ambulatory Visit (HOSPITAL_BASED_OUTPATIENT_CLINIC_OR_DEPARTMENT_OTHER): Payer: Commercial Managed Care - HMO | Admitting: Hematology and Oncology

## 2014-08-28 ENCOUNTER — Telehealth: Payer: Self-pay | Admitting: Hematology and Oncology

## 2014-08-28 VITALS — BP 113/61 | HR 62 | Temp 98.1°F | Resp 18 | Ht 63.5 in | Wt 162.3 lb

## 2014-08-28 DIAGNOSIS — E038 Other specified hypothyroidism: Secondary | ICD-10-CM

## 2014-08-28 DIAGNOSIS — E039 Hypothyroidism, unspecified: Secondary | ICD-10-CM

## 2014-08-28 DIAGNOSIS — Z803 Family history of malignant neoplasm of breast: Secondary | ICD-10-CM

## 2014-08-28 DIAGNOSIS — Z8572 Personal history of non-Hodgkin lymphomas: Secondary | ICD-10-CM

## 2014-08-28 DIAGNOSIS — K900 Celiac disease: Secondary | ICD-10-CM | POA: Diagnosis not present

## 2014-08-28 DIAGNOSIS — C8590 Non-Hodgkin lymphoma, unspecified, unspecified site: Secondary | ICD-10-CM

## 2014-08-28 DIAGNOSIS — R928 Other abnormal and inconclusive findings on diagnostic imaging of breast: Secondary | ICD-10-CM

## 2014-08-28 HISTORY — DX: Hypothyroidism, unspecified: E03.9

## 2014-08-28 LAB — COMPREHENSIVE METABOLIC PANEL (CC13)
ALT: 18 U/L (ref 0–55)
ANION GAP: 6 meq/L (ref 3–11)
AST: 22 U/L (ref 5–34)
Albumin: 4 g/dL (ref 3.5–5.0)
Alkaline Phosphatase: 52 U/L (ref 40–150)
BILIRUBIN TOTAL: 0.46 mg/dL (ref 0.20–1.20)
BUN: 12.5 mg/dL (ref 7.0–26.0)
CO2: 28 meq/L (ref 22–29)
Calcium: 8.7 mg/dL (ref 8.4–10.4)
Chloride: 105 mEq/L (ref 98–109)
Creatinine: 0.8 mg/dL (ref 0.6–1.1)
EGFR: 87 mL/min/{1.73_m2} — AB (ref >90)
Glucose: 90 mg/dl (ref 70–140)
Potassium: 4 mEq/L (ref 3.5–5.1)
SODIUM: 139 meq/L (ref 136–145)
Total Protein: 6.9 g/dL (ref 6.4–8.3)

## 2014-08-28 LAB — CBC WITH DIFFERENTIAL/PLATELET
BASO%: 0.7 % (ref 0.0–2.0)
Basophils Absolute: 0.1 10*3/uL (ref 0.0–0.1)
EOS%: 2.6 % (ref 0.0–7.0)
Eosinophils Absolute: 0.2 10*3/uL (ref 0.0–0.5)
HEMATOCRIT: 39.7 % (ref 34.8–46.6)
HGB: 13.1 g/dL (ref 11.6–15.9)
LYMPH%: 21.8 % (ref 14.0–49.7)
MCH: 31.3 pg (ref 25.1–34.0)
MCHC: 33 g/dL (ref 31.5–36.0)
MCV: 94.9 fL (ref 79.5–101.0)
MONO#: 1.2 10*3/uL — ABNORMAL HIGH (ref 0.1–0.9)
MONO%: 12.7 % (ref 0.0–14.0)
NEUT#: 5.8 10*3/uL (ref 1.5–6.5)
NEUT%: 62.2 % (ref 38.4–76.8)
PLATELETS: 362 10*3/uL (ref 145–400)
RBC: 4.19 10*6/uL (ref 3.70–5.45)
RDW: 13.4 % (ref 11.2–14.5)
WBC: 9.3 10*3/uL (ref 3.9–10.3)
lymph#: 2 10*3/uL (ref 0.9–3.3)

## 2014-08-28 LAB — MORPHOLOGY
PLATELET MORPHOLOGY: NORMAL
PLT EST: ADEQUATE

## 2014-08-28 LAB — LACTATE DEHYDROGENASE (CC13): LDH: 179 U/L (ref 125–245)

## 2014-08-28 NOTE — Telephone Encounter (Signed)
per pof to sch pt appt-gave pt copy of a

## 2014-08-29 DIAGNOSIS — R928 Other abnormal and inconclusive findings on diagnostic imaging of breast: Secondary | ICD-10-CM | POA: Insufficient documentation

## 2014-08-29 DIAGNOSIS — Z803 Family history of malignant neoplasm of breast: Secondary | ICD-10-CM | POA: Insufficient documentation

## 2014-08-29 LAB — FERRITIN CHCC: Ferritin: 35 ng/ml (ref 9–269)

## 2014-08-29 LAB — TSH CHCC: TSH: 3.764 m(IU)/L (ref 0.308–3.960)

## 2014-08-29 NOTE — Assessment & Plan Note (Signed)
Clinically, there is no signs of disease recurrence. Her last imaging study was in 2011. There is no indication to order any routine surveillance imaging. I am concerned about her abnormal mammogram, strong family history of breast and endometrial cancer and her personal history of exposure to radiation. Her blood work also show mild monocytosis of unknown etiology. I would recommend she follows with me on a yearly basis with history, physical examination and blood work.

## 2014-08-29 NOTE — Assessment & Plan Note (Signed)
She has abnormal breast imaging but biopsy had been negative. She has strong family history of breast cancer. The patient desire possible augmentation surgery which I strongly advised against. I recommend addition of screening MRI of the breast given her exposure to radiation in the past.

## 2014-08-29 NOTE — Assessment & Plan Note (Signed)
She has first-degree family with breast cancer and uterine cancer. Her last diagnostic mammogram was suspicious but biopsy was negative. I recommend genetic counseling.

## 2014-08-29 NOTE — Assessment & Plan Note (Signed)
She has been on chronic thyroid replacement therapy. I suspect the hypothyroidism was a long-term side effect from prior radiation exposure. Her thyroid function test has not been rechecked recently. She has 15 pound weight gain over the last 8 months. I will recheck a thyroid function test today

## 2014-08-29 NOTE — Assessment & Plan Note (Signed)
She is adherent to gluten free diet. Her primary doctor has checked vitamin B-12 and vitamin D level recently which were satisfactory. I recommend checking ferritin level due to recent thrombocytosis which could be reactive thrombocytosis.

## 2014-08-29 NOTE — Progress Notes (Signed)
[facility] NOTE  Patient Care Team: Kathyrn Drown, MD as PCP - General (Family Medicine)  CHIEF COMPLAINTS/PURPOSE OF CONSULTATION:  History of diffuse large B-cell lymphoma  HISTORY OF PRESENTING ILLNESS:  [name] 49 y.o. female is here because of prior diagnosis of diffuse large B-cell lymphoma. According to the patient, she was diagnosed around 2002 after self palpation of the neck lump on the left side of the neck. Initially, biopsy was inconclusive. Subsequent excisional biopsy confirmed the diagnosis. The patient was 8 months pregnant with her second child and she has to undergo induced labor. The patient had extensive staging workup and was diagnosed with stage II disease She completed several cycles of chemotherapy followed by involved field radiation therapy. She underwent routine surveillance imaging study 03/03/2009 with no evidence of cancer recurrence. She denies any direct side effects from chemotherapy upon from chemotherapy-induced nausea and vomiting. She denies peripheral neuropathy. After radiation treatment, she developed acquire hypothyroidism and has been on chronic replacement therapy. Over the years, she was diagnosed with vitiligo, psoriasis, and celiac disease. She has been very compliant with his gluten-free diet. Recently, she had 15 pound weight gain and excessive fatigue. She has been on multiple different thyroid supplement. Her last function test was 6 months ago. She has strong family history of breast cancer. She denies any recent abnormal breast examination, palpable mass, abnormal breast appearance or nipple changes She has routine surveillance mammogram. The last few mammograms were abnormal and she underwent diagnostic mammogram and ultrasound guided aspiration which was benign. She denies abnormal Pap smears.  MEDICAL HISTORY:  Past Medical History  Diagnosis Date  . Iron deficiency anemia   . Depression   .  Hypothyroidism   . Low HDL (under 40)   . Fibromyalgia   . 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 Nila Nephew and a mini mantle setup presenting here for the first time on 05/21/2000, finishing chemotherapy as of 09/22/2000, then radiation.    . Folate deficiency 07/09/2012    Started on Folic Acid on 09/03/8887  . Celiac disease   . Vitiligo   . Migraine   . Memory difficulty 02/07/2013  . Hypothyroidism (acquired) 08/28/2014    SURGICAL HISTORY: Past Surgical History  Procedure Laterality Date  . Lumbar disc surgery      artificial disc in lumbar area  . Incontinence surgery    . Colonoscopy    . Breast biopsy      X 2 both benigh  . Tubal ligation    . Esophagogastroduodenoscopy      SOCIAL HISTORY: History   Social History  . Marital Status: Legally Separated    Spouse Name: Morris  . Number of Children: 2  . Years of Education: 12   Occupational History  .     Social History Main Topics  . Smoking status: Never Smoker   . Smokeless tobacco: Never Used  . Alcohol Use: Yes     Comment: rarely-wine  . Drug Use: No  . Sexual Activity: Yes   Other Topics Concern  . Not on file   Social History Narrative   Patient is married (Morris) and lives at home with her husband and her son.   Patient has two children.   Patient is currently not working.   Patient has a high school education.   Patient is right-handed.  Patient drinks two cups of coffee daily.    FAMILY HISTORY: Family History  Problem Relation Age of Onset  . Breast cancer Maternal Aunt 70    breast ca  . Lung cancer Maternal Grandmother   . Colon cancer Maternal Grandmother 56  . Liver cancer Maternal Grandfather   . Breast cancer Mother 58    breast ca  . Uterine cancer Mother 28    uterine ca  . Lung cancer Mother     ALLERGIES:  is allergic to avelox; barley grass;  codeine; soy allergy; and wheat bran.  MEDICATIONS:  Current Outpatient Prescriptions  Medication Sig Dispense Refill  . ALPRAZolam (XANAX) 0.5 MG tablet TAKE 1/2 TO 1 TABLET TWICE A DAY AS NEEDED 35 tablet 4  . beta carotene w/minerals (OCUVITE) tablet Take 1 tablet by mouth daily.    . citalopram (CELEXA) 40 MG tablet TAKE 1 TABLET BY MOUTH EVERY DAY 30 tablet 0  . levothyroxine (LEVOTHROID) 25 MCG tablet Take 1 tablet (25 mcg total) by mouth daily before breakfast. 30 tablet 5  . ondansetron (ZOFRAN) 8 MG tablet Take 1 tablet (8 mg total) by mouth every 8 (eight) hours as needed for nausea or vomiting. 20 tablet 0  . SUMAtriptan (IMITREX) 50 MG tablet Take one at first sign of migraine. May repeat in 2 hours if headache persists or recurs. 12 tablet 2  . zolpidem (AMBIEN) 5 MG tablet Take 1 tablet (5 mg total) by mouth at bedtime as needed for sleep. 30 tablet 4   Current Facility-Administered Medications  Medication Dose Route Frequency Provider Last Rate Last Dose  . cyanocobalamin ((VITAMIN B-12)) injection 1,000 mcg  1,000 mcg Intramuscular Once Gatha Mayer, MD        REVIEW OF SYSTEMS:   Constitutional: Denies fevers, chills or abnormal night sweats Eyes: Denies blurriness of vision, double vision or watery eyes Ears, nose, mouth, throat, and face: Denies mucositis or sore throat Respiratory: Denies cough, dyspnea or wheezes Cardiovascular: Denies palpitation, chest discomfort or lower extremity swelling Gastrointestinal:  Denies nausea, heartburn or change in bowel habits Skin: Denies abnormal skin rashes Lymphatics: Denies new lymphadenopathy or easy bruising Neurological:Denies numbness, tingling or new weaknesses Behavioral/Psych: Mood is stable, no new changes  All other systems were reviewed with the patient and are negative.  PHYSICAL EXAMINATION: ECOG PERFORMANCE STATUS: 1 - Symptomatic but completely ambulatory  Filed Vitals:   08/28/14 1431  BP: 113/61   Pulse: 62  Temp: 98.1 F (36.7 C)  Resp: 18   Filed Weights   08/28/14 1431  Weight: 162 lb 4.8 oz (73.619 kg)    GENERAL:alert, no distress and comfortable SKIN: skin color, texture, turgor are normal, no rashes or significant lesions. Noted vitiligo EYES: normal, conjunctiva are pink and non-injected, sclera clear OROPHARYNX:no exudate, no erythema and lips, buccal mucosa, and tongue normal  NECK: supple, thyroid normal size, non-tender, without nodularity LYMPH:  no palpable lymphadenopathy in the cervical, axillary or inguinal LUNGS: clear to auscultation and percussion with normal breathing effort HEART: regular rate & rhythm and no murmurs and no lower extremity edema ABDOMEN:abdomen soft, non-tender and normal bowel sounds Musculoskeletal:no cyanosis of digits and no clubbing  PSYCH: alert & oriented x 3 with fluent speech NEURO: no focal motor/sensory deficits  LABORATORY DATA:  I have reviewed the data as listed Lab Results  Component Value Date   WBC 9.3 08/28/2014   HGB 13.1 08/28/2014   HCT 39.7 08/28/2014   MCV 94.9 08/28/2014  PLT 362 08/28/2014    Recent Labs  11/09/13 1313 08/28/14 1532  NA 139 139  K 4.6 4.0  CL 103  --   CO2 28 28  GLUCOSE 91 90  BUN 12 12.5  CREATININE 0.70 0.8  CALCIUM 9.1 8.7  PROT 7.0 6.9  ALBUMIN 4.2 4.0  AST 18 22  ALT 15 18  ALKPHOS 43 52  BILITOT 0.7 0.46  BILIDIR 0.1  --   IBILI 0.6  --     RADIOGRAPHIC STUDIES: I review her previous imaging studies I have personally reviewed the radiological images as listed and agreed with the findings in the report.  ASSESSMENT & PLAN:  History of non-Hodgkin's lymphoma Clinically, there is no signs of disease recurrence. Her last imaging study was in 2011. There is no indication to order any routine surveillance imaging. I am concerned about her abnormal mammogram, strong family history of breast and endometrial cancer and her personal history of exposure to  radiation. Her blood work also show mild monocytosis of unknown etiology. I would recommend she follows with me on a yearly basis with history, physical examination and blood work.  Breast lesion on mammography She has abnormal breast imaging but biopsy had been negative. She has strong family history of breast cancer. The patient desire possible augmentation surgery which I strongly advised against. I recommend addition of screening MRI of the breast given her exposure to radiation in the past.   Celiac disease She is adherent to gluten free diet. Her primary doctor has checked vitamin B-12 and vitamin D level recently which were satisfactory. I recommend checking ferritin level due to recent thrombocytosis which could be reactive thrombocytosis.  Family history of breast cancer in female She has first-degree family with breast cancer and uterine cancer. Her last diagnostic mammogram was suspicious but biopsy was negative. I recommend genetic counseling.  Hypothyroidism She has been on chronic thyroid replacement therapy. I suspect the hypothyroidism was a long-term side effect from prior radiation exposure. Her thyroid function test has not been rechecked recently. She has 15 pound weight gain over the last 8 months. I will recheck a thyroid function test today     All questions were answered. The patient knows to call the clinic with any problems, questions or concerns. I spent 40 minutes counseling the patient face to face. The total time spent in the appointment was 60 minutes and more than 50% was on counseling.     [facility], [place], MD 08/29/2014 4:31 PM

## 2014-09-12 ENCOUNTER — Other Ambulatory Visit: Payer: Commercial Managed Care - HMO

## 2014-09-12 ENCOUNTER — Ambulatory Visit (HOSPITAL_BASED_OUTPATIENT_CLINIC_OR_DEPARTMENT_OTHER): Payer: Commercial Managed Care - HMO | Admitting: Genetic Counselor

## 2014-09-12 ENCOUNTER — Encounter: Payer: Self-pay | Admitting: Genetic Counselor

## 2014-09-12 DIAGNOSIS — Z803 Family history of malignant neoplasm of breast: Secondary | ICD-10-CM | POA: Diagnosis not present

## 2014-09-12 DIAGNOSIS — Z800 Family history of malignant neoplasm of digestive organs: Secondary | ICD-10-CM

## 2014-09-12 DIAGNOSIS — Z8572 Personal history of non-Hodgkin lymphomas: Secondary | ICD-10-CM

## 2014-09-12 DIAGNOSIS — C801 Malignant (primary) neoplasm, unspecified: Secondary | ICD-10-CM | POA: Insufficient documentation

## 2014-09-12 DIAGNOSIS — Z315 Encounter for genetic counseling: Secondary | ICD-10-CM

## 2014-09-12 DIAGNOSIS — Z8049 Family history of malignant neoplasm of other genital organs: Secondary | ICD-10-CM

## 2014-09-12 NOTE — Progress Notes (Signed)
REFERRING PROVIDER: Kathyrn Drown, MD [place], [place] [address]   Heath Lark, MD  PRIMARY PROVIDER:  [name], MD  PRIMARY REASON FOR VISIT:  1. Cancer   2. Family history of breast cancer   3. Family history of colon cancer   4. Family history of uterine cancer      HISTORY OF PRESENT ILLNESS:   [name], a 49 y.o. female, was seen for a [facility] cancer genetics consultation at the request of Dr. Wolfgang Phoenix due to a personal and family history of cancer.  [name] presents to clinic today to discuss the possibility of a hereditary predisposition to cancer, genetic testing, and to further clarify her future cancer risks, as well as potential cancer risks for family members.   In 2002, at the age of 45, Ms. Arseneau was diagnosed with non-Hodgkin's lymphoma of the left neck. This was treated with chemotherapy and radiation.  She received radiation from her left jaw to just under her collarbone.  She thinks she was shielded at that time.     CANCER HISTORY:   No history exists.    HORMONAL RISK FACTORS:  Menarche was at age 83.  First live birth at age 39.  OCP use for approximately 8 years.  Ovaries intact: yes.  Hysterectomy: no.  Menopausal status: premenopausal.  HRT use: 0 years. Colonoscopy: yes; normal. Mammogram within the last year: yes. Number of breast biopsies: 2. Up to date with pelvic exams:  yes. Any excessive radiation exposure in the past:  yes  Past Medical History  Diagnosis Date  . Iron deficiency anemia   . Depression   . Hypothyroidism   . Low HDL (under 40)   . Fibromyalgia   . 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 Nila Nephew and a mini mantle setup presenting here for the first time on 05/21/2000, finishing chemotherapy as of 09/22/2000, then radiation.    . Folate deficiency 07/09/2012    Started on  Folic Acid on 3/41/9622  . Celiac disease   . Vitiligo   . Migraine   . Memory difficulty 02/07/2013  . Hypothyroidism (acquired) 08/28/2014  . Family history of breast cancer   . Family history of uterine cancer   . Family history of colon cancer   . Cancer     NHL at 35    Past Surgical History  Procedure Laterality Date  . Lumbar disc surgery      artificial disc in lumbar area  . Incontinence surgery    . Colonoscopy    . Breast biopsy      X 2 both benigh  . Tubal ligation    . Esophagogastroduodenoscopy      History   Social History  . Marital Status: Legally Separated    Spouse Name: Morris  . Number of Children: 2  . Years of Education: 12   Occupational History  .     Social History Main Topics  . Smoking status: Never Smoker   . Smokeless tobacco: Never Used  . Alcohol Use: Yes     Comment: rarely-wine  . Drug Use: No  . Sexual Activity: Yes   Other Topics Concern  . Not on file   Social History Narrative   Patient is married (Morris) and lives at home with her husband and her son.   Patient has two children.  Patient is currently not working.   Patient has a high school education.   Patient is right-handed.   Patient drinks two cups of coffee daily.     FAMILY HISTORY:  We obtained a detailed, 4-generation family history.  Significant diagnoses are listed below: Family History  Problem Relation Age of Onset  . Breast cancer Maternal Aunt 70    breast ca  . Lung cancer Maternal Grandmother   . Colon cancer Maternal Grandmother 38  . Liver cancer Maternal Grandfather   . Breast cancer Mother 9    breast ca  . Uterine cancer Mother 64    uterine ca  . Lung cancer Mother    The patient was diagnosed with NHL at age 1.  She has two boys who are healthy.  [name] has one full sister and two maternal half sisters, all who are healthy.  Her mother was diagnosed with uterine cancer under the age of 56 and breast and lung cancer at age 23.   Her mother had one sister and one brother.  The sister had breast cancer dx at age 79 and her brother passed away in his 3s.  [name]'s maternal grandmother had colon cancer at age 15 and died of lung cancer at age 59.  This grandmother had several siblings that had cancer but the types are unknown.  Her maternal grandfather was diagnosed with liver cancer and died at 52. Ms. Hidalgo's father is alive at 51.  He has on sister who is healthy.  HIs mother was diagnosed with breast cancer in her late 22s,  There is no other reported cancer history. Patient's maternal ancestors are of [place] and [place] descent, and paternal ancestors are of [place] descent. There is no reported Ashkenazi Jewish ancestry. There is no known consanguinity.  GENETIC COUNSELING ASSESSMENT: [name] is a 49 y.o. female with a personal history of NHL and family history of uterine, breast and colon cancer which somewhat suggestive of a Lynch syndrome and predisposition to cancer. We, therefore, discussed and recommended the following at today's visit.   DISCUSSION: We reviewed the characteristics, features and inheritance patterns of hereditary cancer syndromes. Based on the family history we discussed Lynch syndrome and the genes involved with this condition.  Based on the early colon cancer, we could also find mutations in MUTYH, POLE or POLD1, or less commonly based on history, APC.  Based on the family history of breast cancer we also reviewed other genes including BRCA and PTEN, although the family history is less suggestive of mutations in either of these genes.  We also discussed genetic testing, including the appropriate family members to test, the process of testing, insurance coverage and turn-around-time for results. We discussed the implications of a negative, positive and/or variant of uncertain significant result. We recommended Ms. Steinberg pursue genetic testing for the Comprehensive Cancer  gene panel. The Comprehensive Cancer Panel offered by [facility] includes sequencing and/or deletion duplication testing of the following 32 genes: APC, ATM, AXIN2, BARD1, BMPR1A, BRCA1, BRCA2, BRIP1, CDH1, CDK4, CDKN2A, CHEK2, EPCAM, FANCC, MLH1, MSH2, MSH6, MUTYH, NBN, PALB2, PMS2, POLD1, POLE, PTEN, RAD51C, RAD51D, SCG5/GREM1, SMAD4, STK11, TP53, VHL, and XRCC2.     Based on Ms. Totman's family history of cancer, she meets medical criteria for genetic testing. Despite that she meets criteria, she may still have an out of pocket cost. We discussed that if her out of pocket cost for testing is over $100, the laboratory will call and  confirm whether she wants to proceed with testing.  If the out of pocket cost of testing is less than $100 she will be billed by the genetic testing laboratory.   PLAN: After considering the risks, benefits, and limitations, Ms. Spurgin  provided informed consent to pursue genetic testing and the blood sample was sent to [facility] for analysis of the Comprehensive Cancer Panel. Results should be available within approximately 2-3 weeks' time, at which point they will be disclosed by telephone to Ms. Hugh, as will any additional recommendations warranted by these results. Ms. Bedingfield will receive a summary of her genetic counseling visit and a copy of her results once available. This information will also be available in Epic. We encouraged Ms. Graef to remain in contact with cancer genetics annually so that we can continuously update the family history and inform her of any changes in cancer genetics and testing that may be of benefit for her family. Ms. Boylan questions were answered to her satisfaction today. Our contact information was provided should additional questions or concerns arise.  Lastly, we encouraged Ms. Mallon to remain in contact with cancer genetics annually so that we can continuously update the family history and inform her of any changes in cancer  genetics and testing that may be of benefit for this family.   Ms.  Lucien questions were answered to her satisfaction today. Our contact information was provided should additional questions or concerns arise. Thank you for the referral and allowing [place] to share in the care of your patient.   Phuong Hillary P. Florene Glen, [place], [facility] Certified Genetic Counselor [email or web address] phone: [phone]  The patient was seen for a total of 45 minutes in face-to-face genetic counseling.  This patient was discussed with Drs. Magrinat, Lindi Adie and/or Burr Medico who agrees with the above.    _______________________________________________________________________ For Office Staff:  Number of people involved in session: 1 Was an Intern/ student involved with case: yes

## 2014-09-15 ENCOUNTER — Other Ambulatory Visit: Payer: Self-pay | Admitting: Family Medicine

## 2014-09-17 NOTE — Telephone Encounter (Signed)
Ok times 2 will need follow up ov for further

## 2014-09-18 ENCOUNTER — Other Ambulatory Visit: Payer: Self-pay | Admitting: Family Medicine

## 2014-09-19 NOTE — Telephone Encounter (Signed)
This and 1 refill will need ov by fall

## 2014-09-25 DIAGNOSIS — R102 Pelvic and perineal pain: Secondary | ICD-10-CM | POA: Diagnosis not present

## 2014-09-26 DIAGNOSIS — Z01419 Encounter for gynecological examination (general) (routine) without abnormal findings: Secondary | ICD-10-CM | POA: Diagnosis not present

## 2014-09-26 DIAGNOSIS — R87612 Low grade squamous intraepithelial lesion on cytologic smear of cervix (LGSIL): Secondary | ICD-10-CM | POA: Diagnosis not present

## 2014-10-02 ENCOUNTER — Telehealth: Payer: Self-pay | Admitting: Genetic Counselor

## 2014-10-02 NOTE — Telephone Encounter (Signed)
Tried to call patient back to answer questions on when her results should be back.  VM is full and I was not able to leave a message.

## 2014-10-02 NOTE — Telephone Encounter (Signed)
Discussed with [name] that her test results should be back by this Friday.  We are not sure why they are delayed, but sometimes it does take the lab a little closer to 3 weeks to get those back or even a few days over that time.  [name] will call her with those results when we get those in.

## 2014-10-03 ENCOUNTER — Other Ambulatory Visit: Payer: Self-pay

## 2014-10-03 DIAGNOSIS — Z1231 Encounter for screening mammogram for malignant neoplasm of breast: Secondary | ICD-10-CM

## 2014-10-05 ENCOUNTER — Ambulatory Visit: Payer: Self-pay | Admitting: Genetic Counselor

## 2014-10-05 ENCOUNTER — Telehealth: Payer: Self-pay | Admitting: Genetic Counselor

## 2014-10-05 ENCOUNTER — Encounter: Payer: Self-pay | Admitting: Genetic Counselor

## 2014-10-05 DIAGNOSIS — Z803 Family history of malignant neoplasm of breast: Secondary | ICD-10-CM

## 2014-10-05 DIAGNOSIS — Z1379 Encounter for other screening for genetic and chromosomal anomalies: Secondary | ICD-10-CM | POA: Insufficient documentation

## 2014-10-05 DIAGNOSIS — C801 Malignant (primary) neoplasm, unspecified: Secondary | ICD-10-CM

## 2014-10-05 DIAGNOSIS — Z8572 Personal history of non-Hodgkin lymphomas: Secondary | ICD-10-CM

## 2014-10-05 DIAGNOSIS — Z8049 Family history of malignant neoplasm of other genital organs: Secondary | ICD-10-CM

## 2014-10-05 DIAGNOSIS — Z800 Family history of malignant neoplasm of digestive organs: Secondary | ICD-10-CM

## 2014-10-05 NOTE — Progress Notes (Signed)
HPI: [name] was previously seen in the [facility] clinic due to a family history of breast, uterine and colon cancer cancer and concerns regarding a hereditary predisposition to cancer. Please refer to our prior cancer genetics clinic note for more information regarding [name]'s medical, social and family histories, and our assessment and recommendations, at the time. [name] recent genetic test results were disclosed to her, as were recommendations warranted by these results. These results and recommendations are discussed in more detail below.  GENETIC TEST RESULTS: At the time of [name]'s visit, we recommended she pursue genetic testing of the Comprehensive cancer gene panel. The Comprehensive Cancer Panel offered by [facility] includes sequencing and/or deletion duplication testing of the following 32 genes: APC, ATM, AXIN2, BARD1, BMPR1A, BRCA1, BRCA2, BRIP1, CDH1, CDK4, CDKN2A, CHEK2, EPCAM, FANCC, MLH1, MSH2, MSH6, MUTYH, NBN, PALB2, PMS2, POLD1, POLE, PTEN, RAD51C, RAD51D, SCG5/GREM1, SMAD4, STK11, TP53, VHL, and XRCC2.   The report date is October 02, 2014.  Genetic testing was normal, and did not reveal a deleterious mutation in these genes. The test report has been scanned into EPIC and is located under the Media tab.   We discussed with [name] that since the current genetic testing is not perfect, it is possible there may be a gene mutation in one of these genes that current testing cannot detect, but that chance is small. We also discussed, that it is possible that another gene that has not yet been discovered, or that we have not yet tested, is responsible for the cancer diagnoses in the family, and it is, therefore, important to remain in touch with cancer genetics in the future so that we can continue to offer [name] the most up to date genetic testing. [name] revealed that her maternal aunt, who had breast cancer at age 41, is reportedly negative for  hereditary cancer mutations.  CANCER SCREENING RECOMMENDATIONS: Given [name]'s personal and family histories, we must interpret these negative results with some caution.  Families with features suggestive of hereditary risk for cancer tend to have multiple family members with cancer, diagnoses in multiple generations and diagnoses before the age of 21. [name]'s family exhibits some of these features. Thus this result may simply reflect our current inability to detect all mutations within these genes or there may be a different gene that has not yet been discovered or tested.   Based on the [name]'s personal and family history of cancer, as well as her genetic test results, statistical models (Tyrer Cusik)  and literature data were used to estimate her risk of developing breast cancer. This estimates her lifetime risk of developing breast to be approximately 44%.  This risk is taking into consideration the reportedly negative genetic test results in her aunt and [name]'s negative genetic test results. The patient's lifetime breast cancer risk is a preliminary estimate based on available information using one of several models endorsed by the [place] (ACS). The ACS recommends consideration of breast MRI screening as an adjunct to mammography for patients at high risk (defined as 20% or greater lifetime risk). A more detailed breast cancer risk assessment can be considered, if clinically indicated.   [name] has been determined to be at high risk for breast cancer.  Therefore, we recommend that she undergo annual screening with mammography and breast MRI.  We discussed that [name] should discuss her individual situation with her referring physician and determine a breast cancer  screening plan with which they are both comfortable.    RECOMMENDATIONS FOR FAMILY MEMBERS: Women in this family might be at some increased risk of developing cancer, over the general population  risk, simply due to the family history of cancer. We recommended women in this family have a yearly mammogram beginning at age 37, or 75 years younger than the earliest onset of cancer, an an annual clinical breast exam, and perform monthly breast self-exams. Women in this family should also have a gynecological exam as recommended by their primary provider. All family members should have a colonoscopy by age 74.  Based on [name]'s family history, we recommended her mother, who was diagnosed with breast cancer at age 64, have genetic counseling and testing. She reports having tried to get her in for genetic testing in the past and that she has refused.  Therefore, each of [name]'s sisters should consider genetic counseling and testing to determine if there is a hereditary cancer syndrome running in the family that [name] just did not inherit.  FOLLOW-UP: Lastly, we discussed with [name] that cancer genetics is a rapidly advancing field and it is possible that new genetic tests will be appropriate for her and/or her family members in the future. We encouraged her to remain in contact with cancer genetics on an annual basis so we can update her personal and family histories and let her know of advances in cancer genetics that may benefit this family.   Our contact number was provided. [name]. St[place] questions were answered to her satisfaction, and she knows she is welcome to call us at anytime with additional questions or concerns.   Roma Kayser, [name], [facility] Certified Genetic Counselor [email or web address]

## 2014-10-05 NOTE — Telephone Encounter (Signed)
Revealed negative genetic testing on the breast/ovarian cancer panel.  Discussed that she is still considered high risk for developing breast cancer on the [place].  Discussed that someone in the family with breast cancer should consider genetic testing to help determine if there is a hereditayr cancer gene in the family. Her mother refuses to get genetic testing, but her maternal aunt has had genetic testing and is reportedly negative.

## 2014-10-10 DIAGNOSIS — R87612 Low grade squamous intraepithelial lesion on cytologic smear of cervix (LGSIL): Secondary | ICD-10-CM | POA: Diagnosis not present

## 2014-10-14 ENCOUNTER — Other Ambulatory Visit: Payer: Self-pay | Admitting: Family Medicine

## 2014-10-15 NOTE — Telephone Encounter (Signed)
May have this and 3 refills please put on prescription needs office visit by December

## 2014-10-26 LAB — HM PAP SMEAR: HM Pap smear: ABNORMAL

## 2014-11-01 ENCOUNTER — Encounter (HOSPITAL_COMMUNITY): Payer: Self-pay

## 2014-11-01 ENCOUNTER — Ambulatory Visit: Payer: Commercial Managed Care - HMO

## 2014-11-06 ENCOUNTER — Ambulatory Visit
Admission: RE | Admit: 2014-11-06 | Discharge: 2014-11-06 | Disposition: A | Discharge status: Home / Self Care | Payer: Commercial Managed Care - HMO | Source: Ambulatory Visit

## 2014-11-06 DIAGNOSIS — Z1231 Encounter for screening mammogram for malignant neoplasm of breast: Secondary | ICD-10-CM

## 2014-11-13 ENCOUNTER — Ambulatory Visit: Payer: Self-pay | Admitting: Family Medicine

## 2014-11-20 ENCOUNTER — Other Ambulatory Visit: Payer: Self-pay | Admitting: Family Medicine

## 2014-11-26 ENCOUNTER — Ambulatory Visit (INDEPENDENT_AMBULATORY_CARE_PROVIDER_SITE_OTHER): Payer: Medicare Other

## 2014-11-26 ENCOUNTER — Encounter: Payer: Self-pay | Admitting: Family Medicine

## 2014-11-26 ENCOUNTER — Ambulatory Visit (INDEPENDENT_AMBULATORY_CARE_PROVIDER_SITE_OTHER): Payer: Commercial Managed Care - HMO | Admitting: Family Medicine

## 2014-11-26 VITALS — BP 104/75 | HR 78 | Ht 63.0 in | Wt 165.0 lb

## 2014-11-26 DIAGNOSIS — M79671 Pain in right foot: Secondary | ICD-10-CM

## 2014-11-26 DIAGNOSIS — R5383 Other fatigue: Secondary | ICD-10-CM

## 2014-11-26 DIAGNOSIS — E559 Vitamin D deficiency, unspecified: Secondary | ICD-10-CM | POA: Diagnosis not present

## 2014-11-26 DIAGNOSIS — F411 Generalized anxiety disorder: Secondary | ICD-10-CM | POA: Diagnosis not present

## 2014-11-26 DIAGNOSIS — G4700 Insomnia, unspecified: Secondary | ICD-10-CM

## 2014-11-26 DIAGNOSIS — R635 Abnormal weight gain: Secondary | ICD-10-CM

## 2014-11-26 MED ORDER — CITALOPRAM HYDROBROMIDE 40 MG PO TABS: 20.0000 mg | ORAL_TABLET | Freq: Every day | ORAL | Status: DC

## 2014-11-26 MED ORDER — ZOLPIDEM TARTRATE 5 MG PO TABS: 5.0000 mg | ORAL_TABLET | Freq: Every evening | ORAL | Status: DC | PRN

## 2014-11-26 NOTE — Progress Notes (Signed)
CC: [name] is a 49 y.o. female is here for Establish Care   Subjective: HPI:  Very pleasant 49 year old here to establish care,  Complains of fatigue has been present for a matter of months. It's mild to moderate in severity but to the degree where she often doesn't want to go out of the house to engage in all hobbies such as shopping or spending time with her boyfriend. She reports it as low energy but without shortness of breath. She denies any depression or mood disturbance. She had a TSH checked 3 months ago. She is not sure whether or not she was feeling as fatigued at that time. She also admits that she has a history of vitamin D deficiency.  Reports a history of anxiety disorder that is well controlled on half a tablet of Celexa daily. She is taking 20 mg daily without any known side effects.  Complains of right foot pain is localized at the proximal aspect of the small toe joint. It's worse when pressing on the laterally and sometimes when walking. He's been present on a daily basis for one month. She denies any known trauma or overexertion. Pain radiates proximally on the lateral side of the foot  Complains of difficulty with unintentional weight gain for the past year.  Difficulty with falling asleep and staying asleep if she does not take a 5 mg tablet of Ambien on a nightly basis. She is to use Xanax however Ambien seems to be more effective. She tells me she is not taking both, she wants know which one is safer  Review of Systems - General ROS: negative for - chills, fever, night sweats, weight loss Ophthalmic ROS: negative for - decreased vision Psychological ROS: negative for - anxiety or depression ENT ROS: negative for - hearing change, nasal congestion, tinnitus or allergies Hematological and Lymphatic ROS: negative for - bleeding problems, bruising or swollen lymph nodes Breast ROS: negative Respiratory ROS: no cough, shortness of breath, or wheezing Cardiovascular  ROS: no chest pain or dyspnea on exertion Gastrointestinal ROS: no abdominal pain, change in bowel habits, or black or bloody stools Genito-Urinary ROS: negative for - genital discharge, genital ulcers, incontinence or abnormal bleeding from genitals Musculoskeletal ROS: negative for - joint pain or muscle pain other than that described above  Neurological ROS: negative for - headaches or memory loss Dermatological ROS: negative for lumps, mole changes, rash and skin lesion changes  Past Medical History  Diagnosis Date  . Iron deficiency anemia   . Depression   . Hypothyroidism   . Low HDL (under 40)   . Fibromyalgia   . 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 Nila Nephew and a mini mantle setup presenting here for the first time on 05/21/2000, finishing chemotherapy as of 09/22/2000, then radiation.    . Folate deficiency 07/09/2012    Started on Folic Acid on 2/99/3716  . Celiac disease   . Vitiligo   . Migraine   . Memory difficulty 02/07/2013  . Hypothyroidism (acquired) 08/28/2014  . Family history of breast cancer   . Family history of uterine cancer   . Family history of colon cancer   . Cancer     NHL at 35    Past Surgical History  Procedure Laterality Date  . Lumbar disc surgery      artificial disc in lumbar area  . Incontinence surgery    .  Colonoscopy    . Breast biopsy      X 2 both benigh  . Tubal ligation    . Esophagogastroduodenoscopy     Family History  Problem Relation Age of Onset  . Breast cancer Maternal Aunt 70    breast ca  . Lung cancer Maternal Grandmother   . Colon cancer Maternal Grandmother 35  . Liver cancer Maternal Grandfather   . Breast cancer Mother 58    breast ca  . Uterine cancer Mother 28    uterine ca  . Lung cancer Mother     Social History   Social History  . Marital Status: Legally Separated    Spouse  Name: [name]  . Number of Children: 2  . Years of Education: 12   Occupational History  .     Social History Main Topics  . Smoking status: Never Smoker   . Smokeless tobacco: Never Used  . Alcohol Use: Yes     Comment: rarely-wine  . Drug Use: No  . Sexual Activity: Yes   Other Topics Concern  . Not on file   Social History Narrative   Patient is married ([name]) and lives at home with her husband and her son.   Patient has two children.   Patient is currently not working.   Patient has a high school education.   Patient is right-handed.   Patient drinks two cups of coffee daily.     Objective: BP 104/75 mmHg  Pulse 78  Ht 5' 3"  (1.6 m)  Wt 165 lb (74.844 kg)  BMI 29.24 kg/m2  LMP 10/26/2014 (Approximate)  Vital signs reviewed. General: Alert and Oriented, No Acute Distress HEENT: Pupils equal, round, reactive to light. Conjunctivae clear.  External ears unremarkable.  Moist mucous membranes. Lungs: Clear and comfortable work of breathing, speaking in full sentences without accessory muscle use. Cardiac: Regular rate and rhythm.  Neuro: CN II-XII grossly intact, gait normal. Extremities: No peripheral edema.  Strong peripheral pulses. Pain at the 5th mtp reproduced only with lateral pressure, no pain at base of 5th metatarsal. No overlying skin changes.  Mental Status: No depression, anxiety, nor agitation. Logical though process. Skin: Warm and dry.  Assessment & Plan: [name] was seen today for establish care.  Diagnoses and all orders for this visit:  Other fatigue -     TSH  Vitamin D deficiency -     Vit D  25 hydroxy (rtn osteoporosis monitoring)  Generalized anxiety disorder -     zolpidem (AMBIEN) 5 MG tablet; Take 1 tablet (5 mg total) by mouth at bedtime as needed for sleep. -     citalopram (CELEXA) 40 MG tablet; Take 0.5 tablets (20 mg total) by mouth daily.  Right foot pain -     DG Foot Complete Right; Future  Weight  gain  Insomnia   Fatigue: Rule out inadaquate treatment of hypothyroidism vs Vitamin D deficiency.   Anxiety: controlled with celexa Insomnia: controlled with Ambien, discussed this is the "safer" option compared to xanax. Right foot pain: Further eval with Xray needed Weight Gain: If labs are normal will offer phentermine.  Return if symptoms worsen or fail to improve.

## 2014-11-27 ENCOUNTER — Telehealth: Payer: Self-pay | Admitting: Family Medicine

## 2014-11-27 LAB — TSH: TSH: 5.203 u[IU]/mL — ABNORMAL HIGH (ref 0.350–4.500)

## 2014-11-27 LAB — VITAMIN D 25 HYDROXY (VIT D DEFICIENCY, FRACTURES): VIT D 25 HYDROXY: 30 ng/mL (ref 30–100)

## 2014-11-27 MED ORDER — MELOXICAM 15 MG PO TABS: 15.0000 mg | ORAL_TABLET | Freq: Every day | ORAL | Status: DC

## 2014-11-27 MED ORDER — LEVOTHYROXINE SODIUM 50 MCG PO TABS: ORAL_TABLET | ORAL | Status: DC

## 2014-11-27 NOTE — Telephone Encounter (Signed)
Seth Bake, Will you please let patient know that her xray was normal which would suggest that her foot pain is coming from inflamed tendons.  To help relieve this pain I'd recommend starting a daily dose of meloxicam for the next two weeks. Also her thyroid supplement appears to be inadaquatel therefore I've also sent a new 31mg dose of levothyroxine to her pharmacy.  This should hopefully help with her fatigue. F/U in two months.

## 2014-11-27 NOTE — Telephone Encounter (Signed)
Mailbox is full.

## 2014-11-28 NOTE — Telephone Encounter (Signed)
Mailbox full

## 2014-12-03 ENCOUNTER — Encounter: Payer: Self-pay | Admitting: Family Medicine

## 2014-12-04 NOTE — Telephone Encounter (Signed)
Letter mailed

## 2014-12-05 ENCOUNTER — Encounter (HOSPITAL_COMMUNITY): Payer: Self-pay

## 2014-12-05 ENCOUNTER — Encounter: Payer: Self-pay | Admitting: Family Medicine

## 2014-12-05 MED ORDER — PHENTERMINE HCL 37.5 MG PO TABS
37.5000 mg | ORAL_TABLET | Freq: Every day | ORAL | Status: DC
Start: 2014-12-05 — End: 2015-03-22

## 2014-12-11 ENCOUNTER — Ambulatory Visit (HOSPITAL_COMMUNITY)
Admission: RE | Admit: 2014-12-11 | Discharge: 2014-12-11 | Disposition: A | Discharge status: Home / Self Care | Payer: Commercial Managed Care - HMO | Source: Ambulatory Visit | Attending: Hematology and Oncology | Admitting: Hematology and Oncology

## 2014-12-11 ENCOUNTER — Other Ambulatory Visit: Payer: Self-pay | Admitting: Hematology and Oncology

## 2014-12-11 DIAGNOSIS — C8590 Non-Hodgkin lymphoma, unspecified, unspecified site: Secondary | ICD-10-CM | POA: Diagnosis not present

## 2014-12-11 DIAGNOSIS — Z803 Family history of malignant neoplasm of breast: Secondary | ICD-10-CM | POA: Diagnosis not present

## 2014-12-11 DIAGNOSIS — N6012 Diffuse cystic mastopathy of left breast: Secondary | ICD-10-CM | POA: Diagnosis not present

## 2014-12-11 MED ORDER — GADOBENATE DIMEGLUMINE 529 MG/ML IV SOLN
15.0000 mL | Freq: Once | INTRAVENOUS | Status: AC | PRN
  Administered 2014-12-11: 15 mL via INTRAVENOUS

## 2015-03-17 ENCOUNTER — Other Ambulatory Visit: Payer: Self-pay | Admitting: Family Medicine

## 2015-03-22 ENCOUNTER — Encounter: Payer: Self-pay | Admitting: Family Medicine

## 2015-03-22 ENCOUNTER — Ambulatory Visit (INDEPENDENT_AMBULATORY_CARE_PROVIDER_SITE_OTHER): Payer: Commercial Managed Care - HMO | Admitting: Family Medicine

## 2015-03-22 VITALS — BP 124/77 | HR 81 | Wt 171.0 lb

## 2015-03-22 DIAGNOSIS — F411 Generalized anxiety disorder: Secondary | ICD-10-CM | POA: Insufficient documentation

## 2015-03-22 DIAGNOSIS — G4700 Insomnia, unspecified: Secondary | ICD-10-CM | POA: Diagnosis not present

## 2015-03-22 MED ORDER — ALPRAZOLAM 0.5 MG PO TABS: ORAL_TABLET | ORAL | Status: DC

## 2015-03-22 MED ORDER — ZOLPIDEM TARTRATE 10 MG PO TABS: 10.0000 mg | ORAL_TABLET | Freq: Every evening | ORAL | Status: DC | PRN

## 2015-03-22 MED ORDER — CITALOPRAM HYDROBROMIDE 40 MG PO TABS
40.0000 mg | ORAL_TABLET | Freq: Every day | ORAL | Status: DC
Start: 2015-03-22 — End: 2016-04-17

## 2015-03-22 NOTE — Progress Notes (Signed)
CC: [name] is a 50 y.o. female is here for Hospitalization Follow-up and Anxiety   Subjective: HPI:  For emergency room follow-up for chest pain.  All day yesterday she was feeling a mild pressure sensation in the center of her chest. Early that morning she took a Xanax and it seemed to help only for the symptoms return later in the day. She developed another Xanax that evening and it did nothing. She began to get the sensation that she cannot take a deep breath and went to a local emergency room. She had a normal x-ray, unremarkable EKG and unremarkable blood work including that normal troponin and d-dimer. Today she tells me she feels fine. 2 weeks ago she was suffering from a full week of a mild cold. She was frequently coughing but this is all gone. She denies any fevers, chills, wheezing, chest discomfort, recent trauma or motor/sensory disturbances. Today these been no intervention. She tells me looking back she was quite anxious yesterday about this discomfort and has been more anxious over things in general over the past few weeks.   Review Of Systems Outlined In HPI  Past Medical History  Diagnosis Date  . Iron deficiency anemia   . Depression   . Hypothyroidism   . Low HDL (under 40)   . Fibromyalgia   . NHL (non-Hodgkin's lymphoma) ([place]) 07/08/2012    Stage IIA diffuse large B-cell lymphoma status post R-CHOP x6 cycles followed by involved field radiation by Dr. Nila Nephew and a mini mantle setup presenting here for the first time on 05/21/2000, finishing chemotherapy as of 09/22/2000, then radiation.    . Folate deficiency 07/09/2012    Started on Folic Acid on 4/40/3474  . Celiac disease   . Vitiligo   . Migraine   . Memory difficulty 02/07/2013  . Hypothyroidism (acquired) 08/28/2014  . Family history of breast cancer   . Family history of uterine cancer   . Family history of colon cancer   . Cancer ([place])     NHL at 15    Past Surgical History  Procedure  Laterality Date  . Lumbar disc surgery      artificial disc in lumbar area  . Incontinence surgery    . Colonoscopy    . Breast biopsy      X 2 both benigh  . Tubal ligation    . Esophagogastroduodenoscopy     Family History  Problem Relation Age of Onset  . Breast cancer Maternal Aunt 70    breast ca  . Lung cancer Maternal Grandmother   . Colon cancer Maternal Grandmother 53  . Liver cancer Maternal Grandfather   . Breast cancer Mother 70    breast ca  . Uterine cancer Mother 63    uterine ca  . Lung cancer Mother     Social History   Social History  . Marital Status: Legally Separated    Spouse Name: Morris  . Number of Children: 2  . Years of Education: 12   Occupational History  .     Social History Main Topics  . Smoking status: Never Smoker   . Smokeless tobacco: Never Used  . Alcohol Use: Yes     Comment: rarely-wine  . Drug Use: No  . Sexual Activity: Yes   Other Topics Concern  . Not on file   Social History Narrative   Patient is married (Morris) and lives at home with her husband and her son.   Patient has  two children.   Patient is currently not working.   Patient has a high school education.   Patient is right-handed.   Patient drinks two cups of coffee daily.     Objective: BP 124/77 mmHg  Pulse 81  Wt 171 lb (77.565 kg)  SpO2 100%  Vital signs reviewed. General: Alert and Oriented, No Acute Distress HEENT: Pupils equal, round, reactive to light. Conjunctivae clear.  External ears unremarkable.  Moist mucous membranes. Lungs: Clear and comfortable work of breathing, speaking in full sentences without accessory muscle use. Cardiac: Regular rate and rhythm.  Neuro: CN II-XII grossly intact, gait normal. Extremities: No peripheral edema.  Strong peripheral pulses.  Mental Status: No depression, anxiety, nor agitation. Logical though process. Skin: Warm and dry.  Assessment & Plan: [name] was seen today for hospitalization follow-up and  anxiety.  Diagnoses and all orders for this visit:  Generalized anxiety disorder -     zolpidem (AMBIEN) 10 MG tablet; Take 1 tablet (10 mg total) by mouth at bedtime as needed for sleep. -     citalopram (CELEXA) 40 MG tablet; Take 1 tablet (40 mg total) by mouth daily.  Insomnia  Other orders -     ALPRAZolam (XANAX) 0.5 MG tablet; TAKE 1/2-1 TABLET BY MOUTH TWICE DAILY AS NEEDED   Anxiety: Chest pain is likely due to costochondritis versus anxiety therefore increase in citalopram. Continue as needed alprazolam.  Insomnia: Uncontrolled, she tells me that 5 mg is not helping much. Trouble getting asleep but no trouble staying asleep.  25 minutes spent face-to-face during visit today of which at least 50% was counseling or coordinating care regarding: 1. Generalized anxiety disorder   2. Insomnia       Return in about 3 months (around 06/20/2015) for Mood.

## 2015-05-30 ENCOUNTER — Other Ambulatory Visit: Payer: Self-pay | Admitting: Family Medicine

## 2015-06-05 ENCOUNTER — Encounter: Payer: Self-pay | Admitting: Family Medicine

## 2015-06-05 ENCOUNTER — Ambulatory Visit (INDEPENDENT_AMBULATORY_CARE_PROVIDER_SITE_OTHER): Payer: Commercial Managed Care - HMO | Admitting: Family Medicine

## 2015-06-05 VITALS — BP 114/72 | HR 72 | Wt 173.0 lb

## 2015-06-05 DIAGNOSIS — H6982 Other specified disorders of Eustachian tube, left ear: Secondary | ICD-10-CM | POA: Diagnosis not present

## 2015-06-05 DIAGNOSIS — E039 Hypothyroidism, unspecified: Secondary | ICD-10-CM

## 2015-06-05 MED ORDER — MELOXICAM 15 MG PO TABS: 15.0000 mg | ORAL_TABLET | Freq: Every day | ORAL | Status: DC

## 2015-06-05 MED ORDER — PREDNISONE 20 MG PO TABS: ORAL_TABLET | ORAL | Status: AC

## 2015-06-05 NOTE — Progress Notes (Signed)
CC: [name] is a 50 y.o. female is here for Ear Fullness   Subjective: HPI:  Left ear fullness with diminished hearing that's been present for matter of years. Over the past month it's gotten worse. She was blowing her nose and had some pain in the left ear which prompted today's visit. No interventions as of yet. She's had a couple days of nasal congestion and allergies. She denies any discharge from the left ear or any remote or recent trauma. She denies headache fevers, chills nor any sore throat.  Complains of unintentional weight gain despite taking levothyroxine 100% compliance.   Review Of Systems Outlined In HPI  Past Medical History  Diagnosis Date  . Iron deficiency anemia   . Depression   . Hypothyroidism   . Low HDL (under 40)   . Fibromyalgia   . NHL (non-Hodgkin's lymphoma) ([place]) 07/08/2012    Stage IIA diffuse large B-cell lymphoma status post R-CHOP x6 cycles followed by involved field radiation by Dr. Nila [name] and a mini mantle setup presenting here for the first time on 05/21/2000, finishing chemotherapy as of 09/22/2000, then radiation.    . Folate deficiency 07/09/2012    Started on Folic Acid on 9/37/3428  . Celiac disease   . Vitiligo   . Migraine   . Memory difficulty 02/07/2013  . Hypothyroidism (acquired) 08/28/2014  . Family history of breast cancer   . Family history of uterine cancer   . Family history of colon cancer   . Cancer ([place])     NHL at 38    Past Surgical History  Procedure Laterality Date  . Lumbar disc surgery      artificial disc in lumbar area  . Incontinence surgery    . Colonoscopy    . Breast biopsy      X 2 both benigh  . Tubal ligation    . Esophagogastroduodenoscopy     Family History  Problem Relation Age of Onset  . Breast cancer Maternal Aunt 70    breast ca  . Lung cancer Maternal Grandmother   . Colon cancer Maternal Grandmother 60  . Liver cancer Maternal Grandfather   . Breast cancer Mother 70   breast ca  . Uterine cancer Mother 92    uterine ca  . Lung cancer Mother     Social History   Social History  . Marital Status: Legally Separated    Spouse Name: [name]  . Number of Children: 2  . Years of Education: 12   Occupational History  .     Social History Main Topics  . Smoking status: Never Smoker   . Smokeless tobacco: Never Used  . Alcohol Use: Yes     Comment: rarely-wine  . Drug Use: No  . Sexual Activity: Yes   Other Topics Concern  . Not on file   Social History Narrative   Patient is married ([name]) and lives at home with her husband and her son.   Patient has two children.   Patient is currently not working.   Patient has a high school education.   Patient is right-handed.   Patient drinks two cups of coffee daily.     Objective: BP 114/72 mmHg  Pulse 72  Wt 173 lb (78.472 kg)  General: Alert and Oriented, No Acute Distress HEENT: Pupils equal, round, reactive to light. Conjunctivae clear.  External ears unremarkable, canals clear with intact TMs with appropriate landmarks.  Middle ear appears open without effusion. Pink  inferior turbinates.  Moist mucous membranes, pharynx without inflammation nor lesions.  Neck supple without palpable lymphadenopathy nor abnormal masses. Extremities: No peripheral edema.  Strong peripheral pulses.  Mental Status: No depression, anxiety, nor agitation. Skin: Warm and dry.  Assessment & Plan: [name] was seen today for ear fullness.  Diagnoses and all orders for this visit:  Eustachian tube dysfunction, left -     predniSONE (DELTASONE) 20 MG tablet; Three tabs daily days 1-3, two tabs daily days 4-6, one tab daily days 7-9, half tab daily days 10-13.  Hypothyroidism, unspecified hypothyroidism type -     TSH  Other orders -     meloxicam (MOBIC) 15 MG tablet; Take 1 tablet (15 mg total) by mouth daily.  Eustachian tube dysfunction therefore start prednisone tapering call on Monday if not feeling any  better the next step would be Augmentin. Hypothyroidism: She is requesting a TSH which seems reasonable with her recent weight gain She's requesting a refill on meloxicam for minor aches and pains.   Return if symptoms worsen or fail to improve.

## 2015-06-06 LAB — TSH: TSH: 2.5 mIU/L

## 2015-06-11 ENCOUNTER — Telehealth: Payer: Self-pay

## 2015-06-11 MED ORDER — PHENTERMINE HCL 37.5 MG PO TABS
37.5000 mg | ORAL_TABLET | Freq: Every day | ORAL | Status: DC
Start: 2015-06-11 — End: 2015-08-27

## 2015-06-11 NOTE — Telephone Encounter (Signed)
Pt.notified

## 2015-06-11 NOTE — Telephone Encounter (Signed)
Pt was notified of results today from last week.  Pt stated that she has recently took phentermine and would like to give it another try.

## 2015-06-11 NOTE — Telephone Encounter (Signed)
[name], Rx placed in in-box ready for pickup/faxing. Please also let her know that if she wants a refill at the end of this Rx it requires a nurse visit for BP and weight check.

## 2015-06-17 ENCOUNTER — Other Ambulatory Visit: Payer: Self-pay | Admitting: Family Medicine

## 2015-06-18 ENCOUNTER — Other Ambulatory Visit: Payer: Self-pay | Admitting: Family Medicine

## 2015-08-08 ENCOUNTER — Other Ambulatory Visit: Payer: Self-pay | Admitting: Family Medicine

## 2015-08-27 ENCOUNTER — Other Ambulatory Visit (HOSPITAL_BASED_OUTPATIENT_CLINIC_OR_DEPARTMENT_OTHER): Payer: PPO

## 2015-08-27 ENCOUNTER — Ambulatory Visit (HOSPITAL_BASED_OUTPATIENT_CLINIC_OR_DEPARTMENT_OTHER): Payer: PPO | Admitting: Hematology and Oncology

## 2015-08-27 ENCOUNTER — Telehealth: Payer: Self-pay | Admitting: Hematology and Oncology

## 2015-08-27 ENCOUNTER — Other Ambulatory Visit: Payer: Self-pay | Admitting: Hematology and Oncology

## 2015-08-27 VITALS — BP 119/56 | HR 64 | Temp 98.3°F | Resp 18 | Ht 63.0 in | Wt 171.6 lb

## 2015-08-27 DIAGNOSIS — E559 Vitamin D deficiency, unspecified: Secondary | ICD-10-CM

## 2015-08-27 DIAGNOSIS — R079 Chest pain, unspecified: Secondary | ICD-10-CM

## 2015-08-27 DIAGNOSIS — R5383 Other fatigue: Secondary | ICD-10-CM

## 2015-08-27 DIAGNOSIS — D72821 Monocytosis (symptomatic): Secondary | ICD-10-CM

## 2015-08-27 DIAGNOSIS — Z8572 Personal history of non-Hodgkin lymphomas: Secondary | ICD-10-CM

## 2015-08-27 DIAGNOSIS — E538 Deficiency of other specified B group vitamins: Secondary | ICD-10-CM | POA: Insufficient documentation

## 2015-08-27 DIAGNOSIS — E039 Hypothyroidism, unspecified: Secondary | ICD-10-CM | POA: Diagnosis not present

## 2015-08-27 DIAGNOSIS — K900 Celiac disease: Secondary | ICD-10-CM

## 2015-08-27 DIAGNOSIS — D509 Iron deficiency anemia, unspecified: Secondary | ICD-10-CM

## 2015-08-27 LAB — CBC WITH DIFFERENTIAL/PLATELET
BASO%: 1.1 % (ref 0.0–2.0)
Basophils Absolute: 0.1 10*3/uL (ref 0.0–0.1)
EOS%: 3.4 % (ref 0.0–7.0)
Eosinophils Absolute: 0.3 10*3/uL (ref 0.0–0.5)
HEMATOCRIT: 38.8 % (ref 34.8–46.6)
HEMOGLOBIN: 12.7 g/dL (ref 11.6–15.9)
LYMPH#: 1.7 10*3/uL (ref 0.9–3.3)
LYMPH%: 21.5 % (ref 14.0–49.7)
MCH: 30.7 pg (ref 25.1–34.0)
MCHC: 32.8 g/dL (ref 31.5–36.0)
MCV: 93.5 fL (ref 79.5–101.0)
MONO#: 1.2 10*3/uL — AB (ref 0.1–0.9)
MONO%: 15.1 % — ABNORMAL HIGH (ref 0.0–14.0)
NEUT#: 4.6 10*3/uL (ref 1.5–6.5)
NEUT%: 58.9 % (ref 38.4–76.8)
PLATELETS: 416 10*3/uL — AB (ref 145–400)
RBC: 4.15 10*6/uL (ref 3.70–5.45)
RDW: 14.3 % (ref 11.2–14.5)
WBC: 7.8 10*3/uL (ref 3.9–10.3)

## 2015-08-27 LAB — CHCC SMEAR

## 2015-08-27 LAB — COMPREHENSIVE METABOLIC PANEL
ALBUMIN: 3.7 g/dL (ref 3.5–5.0)
ALK PHOS: 63 U/L (ref 40–150)
ALT: 19 U/L (ref 0–55)
ANION GAP: 7 meq/L (ref 3–11)
AST: 19 U/L (ref 5–34)
BUN: 12.8 mg/dL (ref 7.0–26.0)
CALCIUM: 9.1 mg/dL (ref 8.4–10.4)
CO2: 25 mEq/L (ref 22–29)
CREATININE: 0.8 mg/dL (ref 0.6–1.1)
Chloride: 105 mEq/L (ref 98–109)
EGFR: 87 mL/min/{1.73_m2} — ABNORMAL LOW (ref >90)
Glucose: 89 mg/dl (ref 70–140)
Potassium: 4.4 mEq/L (ref 3.5–5.1)
Sodium: 136 mEq/L (ref 136–145)
TOTAL PROTEIN: 7.1 g/dL (ref 6.4–8.3)

## 2015-08-27 NOTE — Telephone Encounter (Signed)
Gave pt avs °

## 2015-08-28 ENCOUNTER — Encounter: Payer: Self-pay | Admitting: Hematology and Oncology

## 2015-08-28 LAB — SEDIMENTATION RATE: SED RATE: 2 mm/h (ref 0–40)

## 2015-08-28 LAB — VITAMIN B12: Vitamin B12: 498 pg/mL (ref 211–946)

## 2015-08-28 LAB — TSH: TSH: 2.096 m[IU]/L (ref 0.308–3.960)

## 2015-08-28 LAB — T4, FREE: T4,Free(Direct): 1.2 ng/dL (ref 0.82–1.77)

## 2015-08-28 LAB — FERRITIN: Ferritin: 37 ng/ml (ref 9–269)

## 2015-08-28 LAB — VITAMIN D 25 HYDROXY (VIT D DEFICIENCY, FRACTURES): Vitamin D, 25-Hydroxy: 32.9 ng/mL (ref 30.0–100.0)

## 2015-08-28 NOTE — Assessment & Plan Note (Signed)
She has been on chronic thyroid replacement therapy. I suspect the hypothyroidism was a long-term side effect from prior radiation exposure. Her thyroid function test has not been rechecked recently. I will recheck a thyroid function test today

## 2015-08-28 NOTE — Assessment & Plan Note (Signed)
The patient has nonspecific vague symptoms of occasional chest pain, fatigue, and abdominal discomfort. She continues to have persistent monocytosis of unknown etiology, raising the suspicion of possible recurrence of cancer or new bone marrow disorder. I recommend CT scan of the chest, abdomen and pelvis with contrast for further evaluation Depending on test results, she may benefit from bone marrow aspirate and biopsy.

## 2015-08-28 NOTE — Progress Notes (Signed)
[name] OFFICE PROGRESS NOTE  Patient Care Team: Marcial Pacas, DO as PCP - General (Family Medicine)  SUMMARY OF ONCOLOGIC HISTORY:  History of diffuse large B-cell lymphoma  HISTORY OF PRESENTING ILLNESS:  [name] 50 y.o. female is here because of prior diagnosis of diffuse large B-cell lymphoma. According to the patient, she was diagnosed around 2002 after self palpation of the neck lump on the left side of the neck. Initially, biopsy was inconclusive. Subsequent excisional biopsy confirmed the diagnosis. The patient was 8 months pregnant with her second child and she has to undergo induced labor. The patient had extensive staging workup and was diagnosed with stage II disease She completed several cycles of chemotherapy followed by involved field radiation therapy. She underwent routine surveillance imaging study 03/03/2009 with no evidence of cancer recurrence. She denies any direct side effects from chemotherapy upon from chemotherapy-induced nausea and vomiting. She denies peripheral neuropathy. After radiation treatment, she developed acquire hypothyroidism and has been on chronic replacement therapy. Over the years, she was diagnosed with vitiligo, psoriasis, and celiac disease. She has been very compliant with his gluten-free diet. She has been on multiple different thyroid supplement. Her last function test was 6 months ago. She has strong family history of breast cancer.  The patient was recommended to undergo genetic counseling and had MRI breast for screening due to prior exposure to radiation She has routine surveillance mammogram. The last few mammograms were abnormal and she underwent diagnostic mammogram and ultrasound guided aspiration which was benign. She denies abnormal Pap smears.   INTERVAL HISTORY: Please see below for problem oriented charting.  She denies any recent abnormal breast examination, palpable mass, abnormal breast appearance or  nipple changes She denies new lymphadenopathy She complained of excessive fatigue. Denies recent infection. She has some vague nonspecific chest discomfort and abdominal discomfort. She has not been taking vitamin supplement recently and wondered about nutritional deficiency due to her chronic celiac disease.  REVIEW OF SYSTEMS:   Constitutional: Denies fevers, chills or abnormal weight loss Eyes: Denies blurriness of vision Ears, nose, mouth, throat, and face: Denies mucositis or sore throat Respiratory: Denies cough, dyspnea or wheezes Cardiovascular: Denies palpitation, chest discomfort or lower extremity swelling Gastrointestinal:  Denies nausea, heartburn or change in bowel habits Skin: Denies abnormal skin rashes Lymphatics: Denies new lymphadenopathy or easy bruising Neurological:Denies numbness, tingling or new weaknesses Behavioral/Psych: Mood is stable, no new changes  All other systems were reviewed with the patient and are negative.  I have reviewed the past medical history, past surgical history, social history and family history with the patient and they are unchanged from previous note.  ALLERGIES:  is allergic to avelox; barley grass; codeine; soy allergy; and wheat bran.  MEDICATIONS:  Current Outpatient Prescriptions  Medication Sig Dispense Refill  . ALPRAZolam (XANAX) 0.5 MG tablet TAKE 1/2-1 TABLET BY MOUTH TWICE DAILY AS NEEDED 35 tablet 1  . citalopram (CELEXA) 40 MG tablet Take 1 tablet (40 mg total) by mouth daily. 90 tablet 1  . levothyroxine (SYNTHROID, LEVOTHROID) 50 MCG tablet TAKE 1 TABLET(50 MCG) BY MOUTH DAILY BEFORE BREAKFAST. NEED FOLLOW UP APPOINTMENT FOR MORE REFILLS 30 tablet 0  . meloxicam (MOBIC) 15 MG tablet TAKE 1 TABLET(15 MG) BY MOUTH DAILY 30 tablet 0  . SUMAtriptan (IMITREX) 50 MG tablet TAKE 1 TABLET BY MOUTH AT FIRST SIGN OF MIGRAINE. MAY REPEAT IN 2 HOURS IF HEADACHE PERSISTS OR RECURS 12 tablet 0  . zolpidem (AMBIEN) 10 MG  tablet TAKE 1  TABLET BY MOUTH EVERY NIGHT AT BEDTIME 30 tablet 0   Current Facility-Administered Medications  Medication Dose Route Frequency Provider Last Rate Last Dose  . cyanocobalamin ((VITAMIN B-12)) injection 1,000 mcg  1,000 mcg Intramuscular Once Gatha Mayer, MD        PHYSICAL EXAMINATION: ECOG PERFORMANCE STATUS: 1 - Symptomatic but completely ambulatory  Filed Vitals:   08/27/15 1320  BP: 119/56  Pulse: 64  Temp: 98.3 F (36.8 C)  Resp: 18   Filed Weights   08/27/15 1320  Weight: 171 lb 9.6 oz (77.837 kg)    GENERAL:alert, no distress and comfortable SKIN: skin color, texture, turgor are normal, no rashes or significant lesions EYES: normal, Conjunctiva are pink and non-injected, sclera clear OROPHARYNX:no exudate, no erythema and lips, buccal mucosa, and tongue normal  NECK: supple, thyroid normal size, non-tender, without nodularity LYMPH:  no palpable lymphadenopathy in the cervical, axillary or inguinal LUNGS: clear to auscultation and percussion with normal breathing effort HEART: regular rate & rhythm and no murmurs and no lower extremity edema ABDOMEN:abdomen soft, non-tender and normal bowel sounds Musculoskeletal:no cyanosis of digits and no clubbing  NEURO: alert & oriented x 3 with fluent speech, no focal motor/sensory deficits  LABORATORY DATA:  I have reviewed the data as listed    Component Value Date/Time   NA 136 08/27/2015 1418   NA 139 11/09/2013 1313   K 4.4 08/27/2015 1418   K 4.6 11/09/2013 1313   CL 103 11/09/2013 1313   CO2 25 08/27/2015 1418   CO2 28 11/09/2013 1313   GLUCOSE 89 08/27/2015 1418   GLUCOSE 91 11/09/2013 1313   BUN 12.8 08/27/2015 1418   BUN 12 11/09/2013 1313   CREATININE 0.8 08/27/2015 1418   CREATININE 0.70 11/09/2013 1313   CREATININE 0.79 12/27/2012 1207   CALCIUM 9.1 08/27/2015 1418   CALCIUM 9.1 11/09/2013 1313   PROT 7.1 08/27/2015 1418   PROT 7.0 11/09/2013 1313   ALBUMIN 3.7 08/27/2015 1418   ALBUMIN 4.2  11/09/2013 1313   AST 19 08/27/2015 1418   AST 18 11/09/2013 1313   ALT 19 08/27/2015 1418   ALT 15 11/09/2013 1313   ALKPHOS 63 08/27/2015 1418   ALKPHOS 43 11/09/2013 1313   BILITOT <0.30 08/27/2015 1418   BILITOT 0.7 11/09/2013 1313   GFRNONAA >90 12/27/2012 1207   GFRAA >90 12/27/2012 1207    No results found for: SPEP, UPEP  Lab Results  Component Value Date   WBC 7.8 08/27/2015   NEUTROABS 4.6 08/27/2015   HGB 12.7 08/27/2015   HCT 38.8 08/27/2015   MCV 93.5 08/27/2015   PLT 416* 08/27/2015      Chemistry      Component Value Date/Time   NA 136 08/27/2015 1418   NA 139 11/09/2013 1313   K 4.4 08/27/2015 1418   K 4.6 11/09/2013 1313   CL 103 11/09/2013 1313   CO2 25 08/27/2015 1418   CO2 28 11/09/2013 1313   BUN 12.8 08/27/2015 1418   BUN 12 11/09/2013 1313   CREATININE 0.8 08/27/2015 1418   CREATININE 0.70 11/09/2013 1313   CREATININE 0.79 12/27/2012 1207      Component Value Date/Time   CALCIUM 9.1 08/27/2015 1418   CALCIUM 9.1 11/09/2013 1313   ALKPHOS 63 08/27/2015 1418   ALKPHOS 43 11/09/2013 1313   AST 19 08/27/2015 1418   AST 18 11/09/2013 1313   ALT 19 08/27/2015 1418   ALT 15 11/09/2013 1313  BILITOT <0.30 08/27/2015 1418   BILITOT 0.7 11/09/2013 1313      ASSESSMENT & PLAN:  History of non-Hodgkin's lymphoma The patient has nonspecific vague symptoms of occasional chest pain, fatigue, and abdominal discomfort. She continues to have persistent monocytosis of unknown etiology, raising the suspicion of possible recurrence of cancer or new bone marrow disorder. I recommend CT scan of the chest, abdomen and pelvis with contrast for further evaluation Depending on test results, she may benefit from bone marrow aspirate and biopsy.  Celiac disease The patient has diagnosis of celiac disease with malabsorption syndromes. She complained of excessive fatigue and nonspecific bone pain. I recommend checking serum ferritin, vitamin D level and  vitamin B-12 to exclude nutritional deficiency.  Hypothyroidism She has been on chronic thyroid replacement therapy. I suspect the hypothyroidism was a long-term side effect from prior radiation exposure. Her thyroid function test has not been rechecked recently. I will recheck a thyroid function test today     Orders Placed This Encounter  Procedures  . CT CHEST W CONTRAST    Standing Status: Future     Number of Occurrences:      Standing Expiration Date: 10/26/2016    Order Specific Question:  Reason for Exam (SYMPTOM  OR DIAGNOSIS REQUIRED)    Answer:  hx lymphoma, chest pain nonspecific, monocytosis, exclude lymphoma recurrence    Order Specific Question:  Is the patient pregnant?    Answer:  No    Order Specific Question:  Preferred imaging location?    Answer:  [facility]  . CT ABDOMEN PELVIS W CONTRAST    Standing Status: Future     Number of Occurrences:      Standing Expiration Date: 11/26/2016    Order Specific Question:  Reason for Exam (SYMPTOM  OR DIAGNOSIS REQUIRED)    Answer:  hx lymphoma, chest pain nonspecific, monocytosis, exclude lymphoma recurrence    Order Specific Question:  Is the patient pregnant?    Answer:  No    Order Specific Question:  Preferred imaging location?    Answer:  [facility]  . Comprehensive metabolic panel    Standing Status: Future     Number of Occurrences: 1     Standing Expiration Date: 09/30/2016  . TSH    Standing Status: Future     Number of Occurrences: 1     Standing Expiration Date: 09/30/2016  . T4, free    Standing Status: Future     Number of Occurrences: 1     Standing Expiration Date: 09/30/2016  . Vitamin B12    Standing Status: Future     Number of Occurrences: 1     Standing Expiration Date: 09/30/2016  . Vitamin D 25 hydroxy    Standing Status: Future     Number of Occurrences: 1     Standing Expiration Date: 09/30/2016  . Ferritin    Standing Status: Future     Number of Occurrences: 1      Standing Expiration Date: 09/30/2016   All questions were answered. The patient knows to call the clinic with any problems, questions or concerns. No barriers to learning was detected. I spent 20 minutes counseling the patient face to face. The total time spent in the appointment was 25 minutes and more than 50% was on counseling and review of test results     [facility], Susan Speth, MD 08/28/2015 8:08 AM

## 2015-08-28 NOTE — Assessment & Plan Note (Signed)
The patient has diagnosis of celiac disease with malabsorption syndromes. She complained of excessive fatigue and nonspecific bone pain. I recommend checking serum ferritin, vitamin D level and vitamin B-12 to exclude nutritional deficiency.

## 2015-08-31 ENCOUNTER — Encounter: Payer: Self-pay | Admitting: Hematology and Oncology

## 2015-09-05 ENCOUNTER — Encounter (HOSPITAL_COMMUNITY): Payer: Self-pay

## 2015-09-05 ENCOUNTER — Ambulatory Visit (HOSPITAL_COMMUNITY)
Admission: RE | Admit: 2015-09-05 | Discharge: 2015-09-05 | Disposition: A | Discharge status: Home / Self Care | Payer: Commercial Managed Care - HMO | Source: Ambulatory Visit | Attending: Hematology and Oncology | Admitting: Hematology and Oncology

## 2015-09-05 DIAGNOSIS — C8590 Non-Hodgkin lymphoma, unspecified, unspecified site: Secondary | ICD-10-CM | POA: Diagnosis not present

## 2015-09-05 DIAGNOSIS — R079 Chest pain, unspecified: Secondary | ICD-10-CM | POA: Insufficient documentation

## 2015-09-05 DIAGNOSIS — D72821 Monocytosis (symptomatic): Secondary | ICD-10-CM | POA: Insufficient documentation

## 2015-09-05 DIAGNOSIS — Z8572 Personal history of non-Hodgkin lymphomas: Secondary | ICD-10-CM | POA: Insufficient documentation

## 2015-09-05 MED ORDER — IOPAMIDOL (ISOVUE-300) INJECTION 61%
100.0000 mL | Freq: Once | INTRAVENOUS | Status: AC | PRN
  Administered 2015-09-05: 100 mL via INTRAVENOUS

## 2015-09-06 ENCOUNTER — Encounter: Payer: Self-pay | Admitting: Hematology and Oncology

## 2015-09-06 ENCOUNTER — Ambulatory Visit (HOSPITAL_BASED_OUTPATIENT_CLINIC_OR_DEPARTMENT_OTHER): Payer: Commercial Managed Care - HMO | Admitting: Hematology and Oncology

## 2015-09-06 VITALS — BP 120/67 | HR 84 | Temp 98.2°F | Resp 17 | Ht 63.0 in | Wt 174.7 lb

## 2015-09-06 DIAGNOSIS — M359 Systemic involvement of connective tissue, unspecified: Secondary | ICD-10-CM | POA: Diagnosis not present

## 2015-09-06 DIAGNOSIS — Z8572 Personal history of non-Hodgkin lymphomas: Secondary | ICD-10-CM

## 2015-09-06 DIAGNOSIS — D72821 Monocytosis (symptomatic): Secondary | ICD-10-CM

## 2015-09-06 DIAGNOSIS — D8989 Other specified disorders involving the immune mechanism, not elsewhere classified: Secondary | ICD-10-CM | POA: Insufficient documentation

## 2015-09-06 NOTE — Assessment & Plan Note (Signed)
The patient is reassured that her blood work and CT imaging show no evidence of disease recurrence. She is a long-term cancer survivor, with no evidence of reoccurrence 15 years from her prior treatment. I will discharge her from the clinic

## 2015-09-06 NOTE — Assessment & Plan Note (Signed)
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 She is wondering about referral to see an endocrinologist. I think it is reasonable to obtain an opinion

## 2015-09-06 NOTE — Assessment & Plan Note (Signed)
She has chronic monocytosis of unknown reason. On imaging study, her spleen is very small. I suspect this is the cause of her monocytosis. She does not need bone marrow biopsy

## 2015-09-06 NOTE — Progress Notes (Signed)
[facility] OFFICE PROGRESS NOTE  Patient Care Team: Marcial Pacas, DO as PCP - General (Family Medicine)  SUMMARY OF ONCOLOGIC HISTORY: History of diffuse large B-cell lymphoma  HISTORY OF PRESENTING ILLNESS:  [name] has prior diagnosis of diffuse large B-cell lymphoma. According to the patient, she was diagnosed around 2002 after self palpation of the neck lump on the left side of the neck. Initially, biopsy was inconclusive. Subsequent excisional biopsy confirmed the diagnosis. The patient was 8 months pregnant with her second child and she has to undergo induced labor. The patient had extensive staging workup and was diagnosed with stage II disease She completed several cycles of chemotherapy followed by involved field radiation therapy. She underwent routine surveillance imaging study 03/03/2009 with no evidence of cancer recurrence. She denies any direct side effects from chemotherapy upon from chemotherapy-induced nausea and vomiting. She denies peripheral neuropathy. After radiation treatment, she developed acquire hypothyroidism and has been on chronic replacement therapy. Over the years, she was diagnosed with vitiligo, psoriasis, and celiac disease. She has been very compliant with his gluten-free diet. She has been on multiple different thyroid supplement. Her last function test was 6 months ago. She has strong family history of breast cancer.  The patient was recommended to undergo genetic counseling and had MRI breast for screening due to prior exposure to radiation She has routine surveillance mammogram. The last few mammograms were abnormal and she underwent diagnostic mammogram and ultrasound guided aspiration which was benign. She denies abnormal Pap smears.  INTERVAL HISTORY: Please see below for problem oriented charting. She returns today with her husband to review test results. She has no other complaints since I saw her  REVIEW OF SYSTEMS:    Constitutional: Denies fevers, chills or abnormal weight loss Eyes: Denies blurriness of vision Ears, nose, mouth, throat, and face: Denies mucositis or sore throat Respiratory: Denies cough, dyspnea or wheezes Cardiovascular: Denies palpitation, chest discomfort or lower extremity swelling Gastrointestinal:  Denies nausea, heartburn or change in bowel habits Skin: Denies abnormal skin rashes Lymphatics: Denies new lymphadenopathy or easy bruising Neurological:Denies numbness, tingling or new weaknesses Behavioral/Psych: Mood is stable, no new changes  All other systems were reviewed with the patient and are negative.  I have reviewed the past medical history, past surgical history, social history and family history with the patient and they are unchanged from previous note.  ALLERGIES:  is allergic to avelox; barley grass; codeine; soy allergy; and wheat bran.  MEDICATIONS:  Current Outpatient Prescriptions  Medication Sig Dispense Refill  . ALPRAZolam (XANAX) 0.5 MG tablet TAKE 1/2-1 TABLET BY MOUTH TWICE DAILY AS NEEDED 35 tablet 1  . citalopram (CELEXA) 40 MG tablet Take 1 tablet (40 mg total) by mouth daily. 90 tablet 1  . levothyroxine (SYNTHROID, LEVOTHROID) 50 MCG tablet TAKE 1 TABLET(50 MCG) BY MOUTH DAILY BEFORE BREAKFAST. NEED FOLLOW UP APPOINTMENT FOR MORE REFILLS 30 tablet 0  . meloxicam (MOBIC) 15 MG tablet TAKE 1 TABLET(15 MG) BY MOUTH DAILY 30 tablet 0  . SUMAtriptan (IMITREX) 50 MG tablet TAKE 1 TABLET BY MOUTH AT FIRST SIGN OF MIGRAINE. MAY REPEAT IN 2 HOURS IF HEADACHE PERSISTS OR RECURS 12 tablet 0  . zolpidem (AMBIEN) 10 MG tablet TAKE 1 TABLET BY MOUTH EVERY NIGHT AT BEDTIME 30 tablet 0   Current Facility-Administered Medications  Medication Dose Route Frequency Provider Last Rate Last Dose  . cyanocobalamin ((VITAMIN B-12)) injection 1,000 mcg  1,000 mcg Intramuscular Once Gatha Mayer, MD  PHYSICAL EXAMINATION: ECOG PERFORMANCE STATUS: 0 -  Asymptomatic  Filed Vitals:   09/06/15 1251  BP: 120/67  Pulse: 84  Temp: 98.2 F (36.8 C)  Resp: 17   Filed Weights   09/06/15 1251  Weight: 174 lb 11.2 oz (79.243 kg)    GENERAL:alert, no distress and comfortable SKIN: skin color, texture, turgor are normal, no rashes or significant lesions EYES: normal, Conjunctiva are pink and non-injected, sclera clear Musculoskeletal:no cyanosis of digits and no clubbing  NEURO: alert & oriented x 3 with fluent speech, no focal motor/sensory deficits  LABORATORY DATA:  I have reviewed the data as listed    Component Value Date/Time   NA 136 08/27/2015 1418   NA 139 11/09/2013 1313   K 4.4 08/27/2015 1418   K 4.6 11/09/2013 1313   CL 103 11/09/2013 1313   CO2 25 08/27/2015 1418   CO2 28 11/09/2013 1313   GLUCOSE 89 08/27/2015 1418   GLUCOSE 91 11/09/2013 1313   BUN 12.8 08/27/2015 1418   BUN 12 11/09/2013 1313   CREATININE 0.8 08/27/2015 1418   CREATININE 0.70 11/09/2013 1313   CREATININE 0.79 12/27/2012 1207   CALCIUM 9.1 08/27/2015 1418   CALCIUM 9.1 11/09/2013 1313   PROT 7.1 08/27/2015 1418   PROT 7.0 11/09/2013 1313   ALBUMIN 3.7 08/27/2015 1418   ALBUMIN 4.2 11/09/2013 1313   AST 19 08/27/2015 1418   AST 18 11/09/2013 1313   ALT 19 08/27/2015 1418   ALT 15 11/09/2013 1313   ALKPHOS 63 08/27/2015 1418   ALKPHOS 43 11/09/2013 1313   BILITOT <0.30 08/27/2015 1418   BILITOT 0.7 11/09/2013 1313   GFRNONAA >90 12/27/2012 1207   GFRAA >90 12/27/2012 1207    No results found for: SPEP, UPEP  Lab Results  Component Value Date   WBC 7.8 08/27/2015   NEUTROABS 4.6 08/27/2015   HGB 12.7 08/27/2015   HCT 38.8 08/27/2015   MCV 93.5 08/27/2015   PLT 416* 08/27/2015      Chemistry      Component Value Date/Time   NA 136 08/27/2015 1418   NA 139 11/09/2013 1313   K 4.4 08/27/2015 1418   K 4.6 11/09/2013 1313   CL 103 11/09/2013 1313   CO2 25 08/27/2015 1418   CO2 28 11/09/2013 1313   BUN 12.8 08/27/2015 1418    BUN 12 11/09/2013 1313   CREATININE 0.8 08/27/2015 1418   CREATININE 0.70 11/09/2013 1313   CREATININE 0.79 12/27/2012 1207      Component Value Date/Time   CALCIUM 9.1 08/27/2015 1418   CALCIUM 9.1 11/09/2013 1313   ALKPHOS 63 08/27/2015 1418   ALKPHOS 43 11/09/2013 1313   AST 19 08/27/2015 1418   AST 18 11/09/2013 1313   ALT 19 08/27/2015 1418   ALT 15 11/09/2013 1313   BILITOT <0.30 08/27/2015 1418   BILITOT 0.7 11/09/2013 1313       RADIOGRAPHIC STUDIES:I reviewed the imaging studies with the patient I have personally reviewed the radiological images as listed and agreed with the findings in the report. Ct Chest W Contrast  09/05/2015  CLINICAL DATA:  Followup non-Hodgkin's lymphoma. Status post chemotherapy. EXAM: CT CHEST, ABDOMEN, AND PELVIS WITH CONTRAST TECHNIQUE: Multidetector CT imaging of the chest, abdomen and pelvis was performed following the standard protocol during bolus administration of intravenous contrast. CONTRAST:  133m ISOVUE-300 IOPAMIDOL (ISOVUE-300) INJECTION 61% COMPARISON:  02/11/2010 FINDINGS: CT CHEST FINDINGS Cardiovascular:  No significant abnormality. Mediastinum/Lymph Nodes: No masses, pathologically enlarged lymph  nodes, or other significant abnormality. Lungs/Pleura: Asymmetric scarring again seen in left lung apex. No suspicious pulmonary nodules or masses. No evidence of pulmonary infiltrate or pleural effusion. Musculoskeletal: No chest wall mass or suspicious bone lesions identified. CT ABDOMEN PELVIS FINDINGS Hepatobiliary: No masses or other significant abnormality. Gallbladder is unremarkable. Pancreas: No mass, inflammatory changes, or other significant abnormality. Spleen: Small splenic size again noted. Adrenals/Urinary Tract: No masses identified. No evidence of hydronephrosis. Stomach/Bowel: No evidence of obstruction, inflammatory process, or abnormal fluid collections. Vascular/Lymphatic: No pathologically enlarged lymph nodes. Previously seen  mildly enlarged mesenteric lymph nodes have resolved since previous exam. No evidence of abdominal aortic aneurysm. Reproductive: Normal retroverted uterus. No adnexal mass identified. Other: None. Musculoskeletal:  No suspicious bone lesions identified. IMPRESSION: Resolution of mild mesenteric lymphadenopathy since previous exam. No evidence of recurrent lymphoma or other acute findings within the chest, abdomen, or pelvis. Electronically Signed   By: Earle Gell M.D.   On: 09/05/2015 17:07   Ct Abdomen Pelvis W Contrast  09/05/2015  CLINICAL DATA:  Followup non-Hodgkin's lymphoma. Status post chemotherapy. EXAM: CT CHEST, ABDOMEN, AND PELVIS WITH CONTRAST TECHNIQUE: Multidetector CT imaging of the chest, abdomen and pelvis was performed following the standard protocol during bolus administration of intravenous contrast. CONTRAST:  160m ISOVUE-300 IOPAMIDOL (ISOVUE-300) INJECTION 61% COMPARISON:  02/11/2010 FINDINGS: CT CHEST FINDINGS Cardiovascular:  No significant abnormality. Mediastinum/Lymph Nodes: No masses, pathologically enlarged lymph nodes, or other significant abnormality. Lungs/Pleura: Asymmetric scarring again seen in left lung apex. No suspicious pulmonary nodules or masses. No evidence of pulmonary infiltrate or pleural effusion. Musculoskeletal: No chest wall mass or suspicious bone lesions identified. CT ABDOMEN PELVIS FINDINGS Hepatobiliary: No masses or other significant abnormality. Gallbladder is unremarkable. Pancreas: No mass, inflammatory changes, or other significant abnormality. Spleen: Small splenic size again noted. Adrenals/Urinary Tract: No masses identified. No evidence of hydronephrosis. Stomach/Bowel: No evidence of obstruction, inflammatory process, or abnormal fluid collections. Vascular/Lymphatic: No pathologically enlarged lymph nodes. Previously seen mildly enlarged mesenteric lymph nodes have resolved since previous exam. No evidence of abdominal aortic aneurysm.  Reproductive: Normal retroverted uterus. No adnexal mass identified. Other: None. Musculoskeletal:  No suspicious bone lesions identified. IMPRESSION: Resolution of mild mesenteric lymphadenopathy since previous exam. No evidence of recurrent lymphoma or other acute findings within the chest, abdomen, or pelvis. Electronically Signed   By: JEarle GellM.D.   On: 09/05/2015 17:07     ASSESSMENT & PLAN:  History of non-Hodgkin's lymphoma The patient is reassured that her blood work and CT imaging show no evidence of disease recurrence. She is a long-term cancer survivor, with no evidence of reoccurrence 15 years from her prior treatment. I will discharge her from the clinic  Monocytosis She has chronic monocytosis of unknown reason. On imaging study, her spleen is very small. I suspect this is the cause of her monocytosis. 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 She is wondering about referral to see an endocrinologist. I think it is reasonable to obtain an opinion   No orders of the defined types were placed in this encounter.   All questions were answered. The patient knows to call the clinic with any problems, questions or concerns. No barriers to learning was detected. I spent 15 minutes counseling the patient face to face. The total time spent in the appointment was 20 minutes and more than 50% was on counseling and review of test results  [facility], Casimiro Lienhard, MD 09/06/2015 2:02 PM

## 2015-09-11 ENCOUNTER — Other Ambulatory Visit: Payer: Self-pay | Admitting: Sports Medicine

## 2015-09-11 NOTE — Telephone Encounter (Signed)
Rx faxed

## 2015-09-11 NOTE — Telephone Encounter (Signed)
Routing rx request to PCP

## 2015-09-11 NOTE — Telephone Encounter (Signed)
[name], Rx placed in in-box ready for pickup/faxing.

## 2016-01-08 ENCOUNTER — Other Ambulatory Visit: Payer: Self-pay | Admitting: *Deleted

## 2016-01-08 MED ORDER — LEVOTHYROXINE SODIUM 50 MCG PO TABS: 50.0000 ug | ORAL_TABLET | Freq: Every day | ORAL | 0 refills | Status: DC

## 2016-02-28 ENCOUNTER — Other Ambulatory Visit: Payer: Self-pay

## 2016-02-28 MED ORDER — SUMATRIPTAN SUCCINATE 50 MG PO TABS: ORAL_TABLET | ORAL | 0 refills | Status: DC

## 2016-04-02 ENCOUNTER — Other Ambulatory Visit: Payer: Self-pay | Admitting: Family Medicine

## 2016-04-02 DIAGNOSIS — Z1231 Encounter for screening mammogram for malignant neoplasm of breast: Secondary | ICD-10-CM

## 2016-04-17 ENCOUNTER — Encounter: Payer: Self-pay | Admitting: Physician Assistant

## 2016-04-17 ENCOUNTER — Ambulatory Visit (INDEPENDENT_AMBULATORY_CARE_PROVIDER_SITE_OTHER): Payer: PPO | Admitting: Physician Assistant

## 2016-04-17 VITALS — BP 103/67 | HR 84 | Ht 63.0 in | Wt 177.0 lb

## 2016-04-17 DIAGNOSIS — E559 Vitamin D deficiency, unspecified: Secondary | ICD-10-CM | POA: Diagnosis not present

## 2016-04-17 DIAGNOSIS — Z0000 Encounter for general adult medical examination without abnormal findings: Secondary | ICD-10-CM

## 2016-04-17 DIAGNOSIS — G43009 Migraine without aura, not intractable, without status migrainosus: Secondary | ICD-10-CM

## 2016-04-17 DIAGNOSIS — E039 Hypothyroidism, unspecified: Secondary | ICD-10-CM

## 2016-04-17 DIAGNOSIS — Z13220 Encounter for screening for lipoid disorders: Secondary | ICD-10-CM

## 2016-04-17 DIAGNOSIS — N938 Other specified abnormal uterine and vaginal bleeding: Secondary | ICD-10-CM

## 2016-04-17 DIAGNOSIS — E669 Obesity, unspecified: Secondary | ICD-10-CM

## 2016-04-17 DIAGNOSIS — F411 Generalized anxiety disorder: Secondary | ICD-10-CM

## 2016-04-17 DIAGNOSIS — E538 Deficiency of other specified B group vitamins: Secondary | ICD-10-CM

## 2016-04-17 DIAGNOSIS — F5101 Primary insomnia: Secondary | ICD-10-CM

## 2016-04-17 DIAGNOSIS — Z131 Encounter for screening for diabetes mellitus: Secondary | ICD-10-CM

## 2016-04-17 MED ORDER — ZOLPIDEM TARTRATE 10 MG PO TABS: 10.0000 mg | ORAL_TABLET | Freq: Every day | ORAL | 1 refills | Status: DC

## 2016-04-17 MED ORDER — CITALOPRAM HYDROBROMIDE 40 MG PO TABS: 40.0000 mg | ORAL_TABLET | Freq: Every day | ORAL | 1 refills | Status: DC

## 2016-04-17 MED ORDER — LIRAGLUTIDE -WEIGHT MANAGEMENT 18 MG/3ML ~~LOC~~ SOPN: 0.6000 mg | PEN_INJECTOR | Freq: Every day | SUBCUTANEOUS | 1 refills | Status: DC

## 2016-04-17 MED ORDER — SUMATRIPTAN SUCCINATE 50 MG PO TABS: ORAL_TABLET | ORAL | 5 refills | Status: DC

## 2016-04-17 NOTE — Patient Instructions (Signed)
[facility] in [place] in [place]

## 2016-04-17 NOTE — Progress Notes (Signed)
Subjective:    Patient ID: [name], female    DOB: 1965/07/07, 51 y.o.   MRN: [id number]  HPI  Pt is a 52 yo female who presents to the clinic to establish care.   .. Active Ambulatory Problems    Diagnosis Date Noted  . Upper back pain on right side 06/06/2012  . Neuropathic pain 06/06/2012  . Hypothyroidism 07/06/2012  . History of non-Hodgkin's lymphoma 07/08/2012  . Folate deficiency 07/09/2012  . Celiac disease 11/16/2012  . History of systemic steroid therapy 11/16/2012  . Thoracic spine pain 12/21/2012  . Vitiligo   . Memory difficulty 02/07/2013  . Fibromyalgia 02/07/2013  . Tremor 08/08/2013  . IBS (irritable bowel syndrome) 10/30/2013  . Hypothyroidism (acquired) 08/28/2014  . Breast lesion on mammography 08/29/2014  . Family history of breast cancer in female 08/29/2014  . Family history of breast cancer   . Family history of uterine cancer   . Family history of colon cancer   . Genetic testing 10/05/2014  . Generalized anxiety disorder 03/22/2015  . Insomnia 03/22/2015  . Monocytosis 08/27/2015  . Chest pain on exertion 08/27/2015  . Fatigue 08/27/2015  . Vitamin D deficiency 08/27/2015  . B12 deficiency 08/27/2015  . Iron deficiency anemia 08/27/2015  . Autoimmune disorder ([place]) 09/06/2015  . DUB (dysfunctional uterine bleeding) 04/17/2016   Resolved Ambulatory Problems    Diagnosis Date Noted  . Loss of weight 07/13/2012  . LUQ pain 07/13/2012  . Cancer [facility])    Past Medical History:  Diagnosis Date  . Cancer ([place])   . Celiac disease   . Depression   . Family history of breast cancer   . Family history of colon cancer   . Family history of uterine cancer   . Fibromyalgia   . Folate deficiency 07/09/2012  . Hypothyroidism   . Hypothyroidism (acquired) 08/28/2014  . Iron deficiency anemia   . Low HDL (under 40)   . Memory difficulty 02/07/2013  . Migraine   . NHL (non-Hodgkin's lymphoma) ([place]) 07/08/2012  . Vitiligo    .[name] Family  History  Problem Relation Age of Onset  . Breast cancer Maternal Aunt 70    breast ca  . Lung cancer Maternal Grandmother   . Colon cancer Maternal Grandmother 55  . Liver cancer Maternal Grandfather   . Breast cancer Mother 82    breast ca  . Uterine cancer Mother 44    uterine ca  . Lung cancer Mother    .[name] Social History   Social History  . Marital status: Legally Separated    Spouse name: [name]  . Number of children: 2  . Years of education: 12   Occupational History  .  Unemployed   Social History Main Topics  . Smoking status: Never Smoker  . Smokeless tobacco: Never Used  . Alcohol use Yes     Comment: rarely-wine  . Drug use: No  . Sexual activity: Yes   Other Topics Concern  . Not on file   Social History Narrative   Patient is married ([name]) and lives at home with her husband and her son.   Patient has two children.   Patient is currently not working.   Patient has a high school education.   Patient is right-handed.   Patient drinks two cups of coffee daily.   Pt would labs to check up.  She is having some irregular bleeding. She will spot for 2 weeks and then have a period and  then spot again. Denies any cramping. Has gyn appt next week.   Pt would like to work on weight loss. She wants to discuss medication.     Review of Systems  All other systems reviewed and are negative.      Objective:   Physical Exam  Constitutional: She is oriented to person, place, and time. She appears well-developed and well-nourished.  HENT:  Head: Normocephalic and atraumatic.  Neck: Normal range of motion. Neck supple. No thyromegaly present.  Pulmonary/Chest: Effort normal and breath sounds normal.  Lymphadenopathy:    She has no cervical adenopathy.  Neurological: She is alert and oriented to person, place, and time.  Psychiatric: She has a normal mood and affect. Her behavior is normal.          Assessment & Plan:  Susan KitchenMarland KitchenDiagnoses and all orders for  this visit:  Preventative health care -     TSH -     T4, free -     Lipid panel -     COMPLETE METABOLIC PANEL WITH GFR -     B12 -     VITAMIN D 25 Hydroxy (Vit-D Deficiency, Fractures) -     IBC panel -     Ferritin -     CBC with Differential/Platelet -     Iron and TIBC  DUB (dysfunctional uterine bleeding) -     TSH -     Lipid panel -     Ferritin -     CBC with Differential/Platelet -     Iron and TIBC  Hypothyroidism, unspecified type -     TSH -     T4, free  Vitamin D deficiency -     VITAMIN D 25 Hydroxy (Vit-D Deficiency, Fractures)  B12 deficiency -     B12  Screening for diabetes mellitus -     COMPLETE METABOLIC PANEL WITH GFR  Screening for lipid disorders -     Lipid panel  Generalized anxiety disorder -     citalopram (CELEXA) 40 MG tablet; Take 1 tablet (40 mg total) by mouth daily.  Migraine without aura and without status migrainosus, not intractable -     SUMAtriptan (IMITREX) 50 MG tablet; TAKE 1 TABLET BY MOUTH AT FIRST SIGN OF MIGRAINE. MAY REPEAT IN 2 HOURS IF HEADACHE PERSISTS OR RECURS.  Obesity (BMI 30-39.9) -     Liraglutide -Weight Management (SAXENDA) 18 MG/3ML SOPN; Inject 0.6 mg into the skin daily. For one week then increase by .44m weekly until reaches 365mdaily.  Please include ultra fine needles 10m58mPrimary insomnia -     zolpidem (AMBIEN) 10 MG tablet; Take 1 tablet (10 mg total) by mouth at bedtime.   Fasting labs ordered.  Refilled medications.   For weight loss started saxenda. Discussed side effects. Discussed taper up.  1500 calorie diet discussed.  150 minutes of exercise weekly encouraged.  Follow up in 2 months.

## 2016-04-18 LAB — CBC WITH DIFFERENTIAL/PLATELET
BASOS ABS: 58 {cells}/uL (ref 0–200)
Basophils Relative: 1 %
EOS ABS: 232 {cells}/uL (ref 15–500)
Eosinophils Relative: 4 %
HEMATOCRIT: 38.1 % (ref 35.0–45.0)
HEMOGLOBIN: 12.6 g/dL (ref 11.7–15.5)
LYMPHS ABS: 1856 {cells}/uL (ref 850–3900)
LYMPHS PCT: 32 %
MCH: 30.4 pg (ref 27.0–33.0)
MCHC: 33.1 g/dL (ref 32.0–36.0)
MCV: 92 fL (ref 80.0–100.0)
MONO ABS: 754 {cells}/uL (ref 200–950)
MPV: 10.4 fL (ref 7.5–12.5)
Monocytes Relative: 13 %
NEUTROS PCT: 50 %
Neutro Abs: 2900 cells/uL (ref 1500–7800)
Platelets: 454 10*3/uL — ABNORMAL HIGH (ref 140–400)
RBC: 4.14 MIL/uL (ref 3.80–5.10)
RDW: 14.2 % (ref 11.0–15.0)
WBC: 5.8 10*3/uL (ref 3.8–10.8)

## 2016-04-18 LAB — LIPID PANEL
CHOL/HDL RATIO: 3.7 ratio (ref <5.0)
CHOLESTEROL: 190 mg/dL (ref <200)
HDL: 52 mg/dL (ref >50)
LDL Cholesterol: 129 mg/dL — ABNORMAL HIGH (ref <100)
Triglycerides: 45 mg/dL (ref <150)
VLDL: 9 mg/dL (ref <30)

## 2016-04-18 LAB — COMPLETE METABOLIC PANEL WITH GFR
ALBUMIN: 4.2 g/dL (ref 3.6–5.1)
ALK PHOS: 52 U/L (ref 33–130)
ALT: 20 U/L (ref 6–29)
AST: 19 U/L (ref 10–35)
BILIRUBIN TOTAL: 0.6 mg/dL (ref 0.2–1.2)
BUN: 18 mg/dL (ref 7–25)
CALCIUM: 9.1 mg/dL (ref 8.6–10.4)
CO2: 26 mmol/L (ref 20–31)
Chloride: 104 mmol/L (ref 98–110)
Creat: 0.8 mg/dL (ref 0.50–1.05)
GFR, Est African American: >89 mL/min (ref >=60)
GFR, Est Non African American: 86 mL/min (ref >=60)
GLUCOSE: 85 mg/dL (ref 65–99)
Potassium: 4.7 mmol/L (ref 3.5–5.3)
Sodium: 140 mmol/L (ref 135–146)
TOTAL PROTEIN: 6.9 g/dL (ref 6.1–8.1)

## 2016-04-18 LAB — IRON AND TIBC
%SAT: 35 % (ref 11–50)
Iron: 112 ug/dL (ref 45–160)
TIBC: 317 ug/dL (ref 250–450)
UIBC: 205 ug/dL (ref 125–400)

## 2016-04-18 LAB — VITAMIN D 25 HYDROXY (VIT D DEFICIENCY, FRACTURES): Vit D, 25-Hydroxy: 29 ng/mL — ABNORMAL LOW (ref 30–100)

## 2016-04-18 LAB — TSH: TSH: 3.26 mIU/L

## 2016-04-18 LAB — T4, FREE: FREE T4: 1 ng/dL (ref 0.8–1.8)

## 2016-04-18 LAB — VITAMIN B12: VITAMIN B 12: 501 pg/mL (ref 200–1100)

## 2016-04-18 LAB — FERRITIN: Ferritin: 68 ng/mL (ref 10–232)

## 2016-04-19 DIAGNOSIS — E669 Obesity, unspecified: Secondary | ICD-10-CM | POA: Insufficient documentation

## 2016-04-19 DIAGNOSIS — G43009 Migraine without aura, not intractable, without status migrainosus: Secondary | ICD-10-CM | POA: Insufficient documentation

## 2016-05-01 ENCOUNTER — Ambulatory Visit
Admission: RE | Admit: 2016-05-01 | Discharge: 2016-05-01 | Disposition: A | Discharge status: Home / Self Care | Payer: Commercial Managed Care - HMO | Source: Ambulatory Visit | Attending: Family Medicine | Admitting: Family Medicine

## 2016-05-01 ENCOUNTER — Other Ambulatory Visit: Payer: Self-pay | Admitting: Physician Assistant

## 2016-05-01 DIAGNOSIS — Z1231 Encounter for screening mammogram for malignant neoplasm of breast: Secondary | ICD-10-CM

## 2016-05-01 NOTE — Progress Notes (Signed)
Call pt: normal mammogram. Follow up in 1 year.

## 2016-06-30 ENCOUNTER — Ambulatory Visit (INDEPENDENT_AMBULATORY_CARE_PROVIDER_SITE_OTHER): Payer: Commercial Managed Care - HMO | Admitting: Physician Assistant

## 2016-06-30 ENCOUNTER — Encounter: Payer: Self-pay | Admitting: Physician Assistant

## 2016-06-30 VITALS — BP 108/69 | HR 79 | Ht 63.0 in | Wt 180.0 lb

## 2016-06-30 DIAGNOSIS — L600 Ingrowing nail: Secondary | ICD-10-CM | POA: Diagnosis not present

## 2016-06-30 NOTE — Patient Instructions (Signed)
Ingrown Toenail An ingrown toenail occurs when the corner or sides of your toenail grow into the surrounding skin. The big toe is most commonly affected, but it can happen to any of your toes. If your ingrown toenail is not treated, you will be at risk for infection. What are the causes? This condition may be caused by:  Wearing shoes that are too small or tight.  Injury or trauma, such as stubbing your toe or having your toe stepped on.  Improper cutting or care of your toenails.  Being born with (congenital) nail or foot abnormalities, such as having a nail that is too big for your toe. What increases the risk? Risk factors for an ingrown toenail include:  Age. Your nails tend to thicken as you get older, so ingrown nails are more common in older people.  Diabetes.  Cutting your toenails incorrectly.  Blood circulation problems. What are the signs or symptoms? Symptoms may include:  Pain, soreness, or tenderness.  Redness.  Swelling.  Hardening of the skin surrounding the toe. Your ingrown toenail may be infected if there is fluid, pus, or drainage. How is this diagnosed? An ingrown toenail may be diagnosed by medical history and physical exam. If your toenail is infected, your health care provider may test a sample of the drainage. How is this treated? Treatment depends on the severity of your ingrown toenail. Some ingrown toenails may be treated at home. More severe or infected ingrown toenails may require surgery to remove all or part of the nail. Infected ingrown toenails may also be treated with antibiotic medicines. Follow these instructions at home:  If you were prescribed an antibiotic medicine, finish all of it even if you start to feel better.  Soak your foot in warm soapy water for 20 minutes, 3 times per day or as directed by your health care provider.  Carefully lift the edge of the nail away from the sore skin by wedging a small piece of cotton under the  corner of the nail. This may help with the pain. Be careful not to cause more injury to the area.  Wear shoes that fit well. If your ingrown toenail is causing you pain, try wearing sandals, if possible.  Trim your toenails regularly and carefully. Do not cut them in a curved shape. Cut your toenails straight across. This prevents injury to the skin at the corners of the toenail.  Keep your feet clean and dry.  If you are having trouble walking and are given crutches by your health care provider, use them as directed.  Do not pick at your toenail or try to remove it yourself.  Take medicines only as directed by your health care provider.  Keep all follow-up visits as directed by your health care provider. This is important. Contact a health care provider if:  Your symptoms do not improve with treatment. Get help right away if:  You have red streaks that start at your foot and go up your leg.  You have a fever.  You have increased redness, swelling, or pain.  You have fluid, blood, or pus coming from your toenail. This information is not intended to replace advice given to you by your health care provider. Make sure you discuss any questions you have with your health care provider. Document Released: 02/14/2000 Document Revised: 07/19/2015 Document Reviewed: 01/10/2014 Elsevier Interactive Patient Education  2017 [employer].

## 2016-06-30 NOTE — Progress Notes (Signed)
   Subjective:    Patient ID: [name], female    DOB: [birth year], 51 y.o.   MRN: [id number]  HPI Pt is a 51 yo female who presents to the clinic with painful ingrown left great toenail medial nailbed. She went to UC and started on antibiotic 3 days ago.    Review of Systems  All other systems reviewed and are negative.      Objective:   Physical Exam  Skin:  Left medial nail bed ingrown toenail with erythema, swelling, and slight draining pus. Toenail is thick and yellow.           Assessment & Plan:  Marland KitchenMarland KitchenDiagnoses and all orders for this visit:  Ingrown left greater toenail   Toenail Avulsion Procedure Note  Pre-operative Diagnosis: Left Ingrown Great toenail   Post-operative Diagnosis: Left Ingrown Great toenail  Indications: pain  Anesthesia: Lidocaine 1% without epinephrine without added sodium bicarbonate  Procedure Details  History of allergy to iodine: no  The risks (including bleeding and infection) and benefits of the  procedure and Verbal informed consent obtained.  After digital block anesthesia was obtained, a tourniquet was applied for hemostasis during the procedure.  After prepping with Betadine, the offending edge of the nail was freed from the nailbed and perionychium, and then split with scissors and removed with  forceps.  All visible granulation tissue is debrided. Phenol applied to nailbed. Antibiotic and bulky dressing was applied.   Findings: Ingrown toenail.   Complications: pain.  Plan: 1. Soak the foot twice daily. Change dressing twice daily until healed over. 2. Warning signs of infection were reviewed.   3. Recommended that the patient use OTC acetaminophen as needed for pain.   Discussed treatment for toenail fungus she has tried jubulia and due to elevated liver enzymes not recommended to take lamasil.

## 2016-07-06 ENCOUNTER — Other Ambulatory Visit: Payer: Self-pay | Admitting: Family Medicine

## 2016-07-06 ENCOUNTER — Other Ambulatory Visit: Payer: Self-pay | Admitting: Physician Assistant

## 2016-07-06 ENCOUNTER — Other Ambulatory Visit: Payer: Self-pay | Admitting: *Deleted

## 2016-07-06 DIAGNOSIS — F5101 Primary insomnia: Secondary | ICD-10-CM

## 2016-07-06 MED ORDER — ALPRAZOLAM 0.5 MG PO TABS: ORAL_TABLET | ORAL | 0 refills | Status: DC

## 2016-08-28 ENCOUNTER — Other Ambulatory Visit: Payer: Self-pay | Admitting: *Deleted

## 2016-08-28 MED ORDER — MELOXICAM 15 MG PO TABS: 15.0000 mg | ORAL_TABLET | Freq: Every day | ORAL | 1 refills | Status: DC | PRN

## 2016-09-03 ENCOUNTER — Ambulatory Visit (INDEPENDENT_AMBULATORY_CARE_PROVIDER_SITE_OTHER): Payer: 59 | Admitting: Family Medicine

## 2016-09-03 ENCOUNTER — Ambulatory Visit (INDEPENDENT_AMBULATORY_CARE_PROVIDER_SITE_OTHER): Payer: 59

## 2016-09-03 ENCOUNTER — Other Ambulatory Visit: Payer: Self-pay | Admitting: Family Medicine

## 2016-09-03 VITALS — BP 110/67 | HR 73 | Temp 98.6°F | Wt 173.0 lb

## 2016-09-03 DIAGNOSIS — M2580 Other specified joint disorders, unspecified joint: Secondary | ICD-10-CM | POA: Diagnosis not present

## 2016-09-03 DIAGNOSIS — E669 Obesity, unspecified: Secondary | ICD-10-CM | POA: Diagnosis not present

## 2016-09-03 DIAGNOSIS — M7711 Lateral epicondylitis, right elbow: Secondary | ICD-10-CM | POA: Insufficient documentation

## 2016-09-03 DIAGNOSIS — S92902A Unspecified fracture of left foot, initial encounter for closed fracture: Secondary | ICD-10-CM | POA: Diagnosis not present

## 2016-09-03 DIAGNOSIS — W228XXA Striking against or struck by other objects, initial encounter: Secondary | ICD-10-CM | POA: Diagnosis not present

## 2016-09-03 DIAGNOSIS — S92812A Other fracture of left foot, initial encounter for closed fracture: Secondary | ICD-10-CM

## 2016-09-03 NOTE — Progress Notes (Signed)
IZZIE [name] is a 51 y.o. female who presents to [facility] today for right foot and right elbow pain.  Right foot pain: Patient is a several month history of pain in the right great toe. Pain is located at the plantar tone is worse with activity. She notes that she may have injured it when she hit the sole of her foot on a hard surface. She has tried a variety of different shoes which have only been a little bit helpful.  Right elbow pain: Patient has pain at the right lateral elbow. This is worst with wrist extension and grip. She denies any radiating pain weakness or numbness fevers or chills.  Obesity: Patient notes that she's had trouble losing weight in the past and is interested in a ketogenic diet. She's been eating this diet for about 3 weeks and has lost 5 pounds. She notes that it helps to control her appetite.   Past Medical History:  Diagnosis Date  . Cancer ([place])    NHL at 36  . Celiac disease   . Depression   . Family history of breast cancer   . Family history of colon cancer   . Family history of uterine cancer   . Fibromyalgia   . Folate deficiency 2/35/3614   Started on Folic Acid on 4/31/5400  . Hypothyroidism   . Hypothyroidism (acquired) 08/28/2014  . Iron deficiency anemia   . Low HDL (under 40)   . Memory difficulty 02/07/2013  . Migraine   . 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    . Vitiligo    Past Surgical History:  Procedure Laterality Date  . BREAST BIOPSY     X 2 both benigh  . BREAST CYST ASPIRATION Left 10/02/2013  . BREAST EXCISIONAL BIOPSY Left 01/04/2007  . COLONOSCOPY    . ESOPHAGOGASTRODUODENOSCOPY    . INCONTINENCE SURGERY      . LUMBAR DISC SURGERY     artificial disc in lumbar area  . TUBAL LIGATION     Social History  Substance Use Topics  . Smoking status: Never Smoker  . Smokeless tobacco: Never Used  . Alcohol use Yes     Comment: rarely-wine     ROS:  As above   Medications: Current Outpatient Prescriptions  Medication Sig Dispense Refill  . ALPRAZolam (XANAX) 0.5 MG tablet TAKE 1/2-1 TABLET BY MOUTH TWICE DAILY AS NEEDED 35 tablet 0  . citalopram (CELEXA) 40 MG tablet Take 1 tablet (40 mg total) by mouth daily. 90 tablet 1  . levothyroxine (SYNTHROID, LEVOTHROID) 50 MCG tablet TAKE 1 TABLET(50 MCG) BY MOUTH DAILY BEFORE BREAKFAST 90 tablet 0  . Liraglutide -Weight Management (SAXENDA) 18 MG/3ML SOPN Inject 0.6 mg into the skin daily. For one week then increase by .35m weekly until reaches 3530mdaily.  Please include ultra fine needles 30m93m pen 1  . meloxicam (MOBIC) 15 MG tablet Take 1 tablet (15 mg total) by mouth daily as needed for pain. 30 tablet 1  . SUMAtriptan (IMITREX) 50 MG tablet TAKE 1 TABLET BY MOUTH AT FIRST SIGN OF MIGRAINE. MAY REPEAT IN 2 HOURS IF HEADACHE PERSISTS OR RECURS. 12 tablet 5  . zolpidem (AMBIEN) 10 MG tablet TAKE 1 TABLET  BY MOUTH EVERY NIGHT AT BEDTIME 30 tablet 0   Current Facility-Administered Medications  Medication Dose Route Frequency Provider Last Rate Last Dose  . cyanocobalamin ((VITAMIN B-12)) injection 1,000 mcg  1,000 mcg Intramuscular Once Susan Mayer, MD       Allergies  Allergen Reactions  . Avelox [Moxifloxacin Hcl In Nacl] Nausea Only  . Barley Grass   . Codeine Nausea And Vomiting  . Soy Allergy     Per patient [name] told her she should also state that she could have a soy allergy as soy and other products are grown in alternate crops and could have cross contamination. No symptoms reported.  . Wheat Bran Other (See Comments)    Damages intestines, due to Celiac disease, [place] and Barley     Exam:  BP 110/67 (BP Location: Left  Arm, Patient Position: Sitting, Cuff Size: Normal)   Pulse 73   Temp 98.6 F (37 C) (Oral)   Wt 173 lb (78.5 kg)   SpO2 99%   BMI 30.65 kg/m   Wt Readings from Last 10 Encounters:  09/03/16 173 lb (78.5 kg)  06/30/16 180 lb (81.6 kg)  04/17/16 177 lb (80.3 kg)  09/06/15 174 lb 11.2 oz (79.2 kg)  08/27/15 171 lb 9.6 oz (77.8 kg)  06/05/15 173 lb (78.5 kg)  03/22/15 171 lb (77.6 kg)  11/26/14 165 lb (74.8 kg)  08/28/14 162 lb 4.8 oz (73.6 kg)  02/28/14 149 lb (67.6 kg)    General: Well Developed, well nourished, and in no acute distress.  Neuro/Psych: Alert and oriented x3, extra-ocular muscles intact, able to move all 4 extremities, sensation grossly intact. Skin: Warm and dry, no rashes noted.  Respiratory: Not using accessory muscles, speaking in full sentences, trachea midline.  Cardiovascular: Pulses palpable, no extremity edema. Abdomen: Does not appear distended. MSK:  Right elbow normal-appearing tender to palpation lateral epicondyle. Normal elbow motion. Pain with resisted wrist extension.  Right foot normal-appearing. Mildly tender to palpation at the plantar first MTP. Decreased toe dorsiflexion normal plantar flexion. Pain with resisted toe. Plantar flexion. Pulses capillary refill and sensation are intact.  Limited musculoskeletal ultrasound of the right plantar first MTP shows intact sesamoid and flexor tendon. No increased Doppler activity. Normal ultrasound.  No results found for this or any previous visit (from the past 48 hour(s)). No results found.    Assessment and Plan: 51 y.o. female with  Right elbow pain is lateral epicondylitis. Plan for stretching and eccentric home exercises.  Right great toe pain is likely sesamoiditis. X-ray pending. Plan for return for orthotics with a metatarsal float.  Obesity: We discussed the pros and cons of the ketogenic diet. I recommend tracking macros and measuring food with a food scale.    No orders of the  defined types were placed in this encounter.  No orders of the defined types were placed in this encounter.   Discussed warning signs or symptoms. Please see discharge instructions. Patient expresses understanding.

## 2016-09-03 NOTE — Patient Instructions (Signed)
Thank you for coming in today. Get xray today.  Reschedule soon for Orthotics.  Bring a variety of shoes with you.   As for the elbow I think you have tennis elbow.  Do the exercises we discussed.    Sesamoid Injury A sesamoid injury happens when a sesamoid bone or a surrounding tendon gets damaged during activity. A sesamoid bone is a bone that is connected to a tendon or muscle but not to a joint. Your kneecap is an example of a sesamoid bone. Examples of sesamoid injuries include irritation, dislocation, or a crack or break (fracture) in a sesamoid bone. Most sesamoid injuries affect the sesamoid bones under the big toe. These bones help you to move forward during weight-bearing activities. What are the causes? This condition is caused by damage to a sesamoid bone or a surrounding tendon. What increases the risk? This condition is more likely to develop in people who:  Dance ballet.  Run.  Play sports.  Are active on artificial turf.  Wear high heels.  What are the signs or symptoms? Symptoms of this condition include:  Pain under the big toe.  Pain when you try to straighten your big toe.  A popping sound that happens at the time of injury.  Swelling.  Bruising.  How is this diagnosed? This condition is diagnosed with:  A physical exam.  Observation of your movement while you walk.  An X-ray.  Bone scans.  How is this treated? Treatment for this condition depends on the location, type, and severity of the injury. Treatment may involve:  Resting the affected area.  Applying ice to the affected area.  Taking over-the-counter pain medicine.  Placing a cushioned pad in the shoe.  Avoiding activities that are causing injury.  Taping your toe to prevent movement.  Getting steroid injections.  Wearing a cast, brace, or orthotic shoe.  Physical therapy.  Surgery.  Follow these instructions at home: If you have a cast:  Do not stick anything  inside the cast to scratch your skin. Doing that increases your risk of infection.  Check the skin around the cast every day. Report any concerns to your health care provider. You may put lotion on dry skin around the edges of the cast. Do not apply lotion to the skin underneath the cast.  Do not put pressure on any part of the cast until it is fully hardened. This may take several hours.  Do not let your cast get wet if it is not waterproof.  Keep the cast clean. If you have a brace:  Wear the brace as told by your health care provider. Remove it only as told by your health care provider.  Loosen the brace if your toes become numb and tingle, or if they turn cold and blue.  Do not let your brace get wet if it is not waterproof.  Keep the brace clean. Bathing  Do not take baths, swim, or use a hot tub until your health care provider approves. Ask your health care provider if you can take showers. You may only be allowed to take sponge baths for bathing.  If your cast or brace is not waterproof, cover it with a watertight plastic bag when you take a bath or shower. Managing pain, stiffness, and swelling  If directed, apply ice to the injured area. ? Put ice in a plastic bag. ? Place a towel between your skin and the bag. ? Leave the ice on for 20 minutes, 2-3  times per day.  Move your toes often to avoid stiffness and to lessen swelling.  Raise (elevate) the injured area above the level of your heart while you are sitting or lying down. Driving  Do not drive or operate heavy machinery while taking certain prescription pain medicines.  Ask your health care provider when it is safe to drive if you have a cast, brace, or orthotic shoe on a foot that you use for driving. Activity  Return to your normal activities as told by your health care provider. Ask your health care provider what activities are safe for you.  Do exercises as told by your health care provider or physical  therapist. Safety  Do not use the injured limb to support your body weight until your health care provider says that you can. Use crutches as told by your health care provider. General instructions  Do not use any tobacco products, including cigarettes, chewing tobacco, or e-cigarettes. Tobacco can delay bone healing. If you need help quitting, ask your health care provider.  Take over-the-counter and prescription medicines only as told by your health care provider.  Keep all follow-up visits as told by your health care provider. This is important. Contact a health care provider if:  Pain and swelling continue even with treatment.  Pain and swelling return after you get back to your normal activities.  You cannot put pressure on your foot. Get help right away if:  You lose sensation in your foot.  Your toes turn cold and blue. This information is not intended to replace advice given to you by your health care provider. Make sure you discuss any questions you have with your health care provider. Document Released: 02/16/2005 Document Revised: 01/16/2016 Document Reviewed: 08/29/2014 Elsevier Interactive Patient Education  2017 Elsevier Inc.    Tennis Elbow Tennis elbow is puffiness (inflammation) of the outer tendons of your forearm close to your elbow. Your tendons attach your muscles to your bones. Tennis elbow can happen in any sport or job in which you use your elbow too much. It is caused by doing the same motion over and over. Tennis elbow can cause:  Pain and tenderness in your forearm and the outer part of your elbow.  A burning feeling. This runs from your elbow through your arm.  Weak grip in your hands.  Follow these instructions at home: Activity  Rest your elbow and wrist as told by your doctor. Try to avoid any activities that caused the problem until your doctor says that you can do them again.  If a physical therapist teaches you exercises, do all of them as  told.  If you lift an object, lift it with your palm facing up. This is easier on your elbow. Lifestyle  If your tennis elbow is caused by sports, check your equipment and make sure that: ? You are using it correctly. ? It fits you well.  If your tennis elbow is caused by work, take breaks often, if you are able. Talk with your manager about doing your work in a way that is safe for you. ? If your tennis elbow is caused by computer use, talk with your manager about any changes that can be made to your work setup. General instructions  If told, apply ice to the painful area: ? Put ice in a plastic bag. ? Place a towel between your skin and the bag. ? Leave the ice on for 20 minutes, 2-3 times per day.  Take medicines only  as told by your doctor.  If you were given a brace, wear it as told by your doctor.  Keep all follow-up visits as told by your doctor. This is important. Contact a doctor if:  Your pain does not get better with treatment.  Your pain gets worse.  You have weakness in your forearm, hand, or fingers.  You cannot feel your forearm, hand, or fingers. This information is not intended to replace advice given to you by your health care provider. Make sure you discuss any questions you have with your health care provider. Document Released: 08/06/2009 Document Revised: 10/17/2015 Document Reviewed: 02/12/2014 Elsevier Interactive Patient Education  [employer].

## 2016-09-07 ENCOUNTER — Ambulatory Visit (INDEPENDENT_AMBULATORY_CARE_PROVIDER_SITE_OTHER): Payer: PPO | Admitting: Family Medicine

## 2016-09-07 ENCOUNTER — Encounter: Payer: Self-pay | Admitting: Family Medicine

## 2016-09-07 VITALS — BP 114/61 | HR 80 | Wt 172.0 lb

## 2016-09-07 DIAGNOSIS — S92812A Other fracture of left foot, initial encounter for closed fracture: Secondary | ICD-10-CM

## 2016-09-07 DIAGNOSIS — M2580 Other specified joint disorders, unspecified joint: Secondary | ICD-10-CM | POA: Insufficient documentation

## 2016-09-07 NOTE — Patient Instructions (Signed)
Thank you for coming in today. Recheck in 4 weeks.  Continue the orthotics.

## 2016-09-07 NOTE — Progress Notes (Signed)
    Orthotics Note:   Patient was fitted for a : standard, cushioned, semi-rigid orthotic. The orthotic was heated and afterward the patient stood on the orthotic blank positioned on the orthotic stand. The patient was positioned in subtalar neutral position and 10 degrees of ankle dorsiflexion in a weight bearing stance. After completion of molding, a stable base was applied to the orthotic blank. The blank was ground to a stable position for weight bearing. Size: 8 Base: [employer] and safety inspector and [place]: Left sided sesamoid float The patient ambulated these, and they were very comfortable.  I spent 40 minutes with this patient, greater than 50% was face-to-face time counseling regarding the below diagnosis.

## 2016-10-11 ENCOUNTER — Other Ambulatory Visit: Payer: Self-pay | Admitting: Physician Assistant

## 2016-10-11 DIAGNOSIS — F5101 Primary insomnia: Secondary | ICD-10-CM

## 2016-10-12 ENCOUNTER — Other Ambulatory Visit: Payer: Self-pay

## 2016-10-12 DIAGNOSIS — F5101 Primary insomnia: Secondary | ICD-10-CM

## 2016-10-12 MED ORDER — ZOLPIDEM TARTRATE 10 MG PO TABS: 10.0000 mg | ORAL_TABLET | Freq: Every day | ORAL | 0 refills | Status: DC

## 2016-11-10 ENCOUNTER — Other Ambulatory Visit: Payer: Self-pay | Admitting: Physician Assistant

## 2016-11-10 DIAGNOSIS — F5101 Primary insomnia: Secondary | ICD-10-CM

## 2016-11-17 ENCOUNTER — Ambulatory Visit (INDEPENDENT_AMBULATORY_CARE_PROVIDER_SITE_OTHER): Payer: PPO | Admitting: Family Medicine

## 2016-11-17 VITALS — BP 99/71 | HR 75 | Temp 98.1°F | Wt 173.0 lb

## 2016-11-17 DIAGNOSIS — Z23 Encounter for immunization: Secondary | ICD-10-CM

## 2016-11-17 NOTE — Progress Notes (Signed)
Pt is here today for flu vaccine.  Orders Placed This Encounter  Procedures  . Flu Vaccine QUAD 6+ mos PF IM (Fluarix Quad PF)

## 2016-12-14 ENCOUNTER — Encounter: Payer: Self-pay | Admitting: Emergency Medicine

## 2016-12-14 ENCOUNTER — Emergency Department
Admission: EM | Admit: 2016-12-14 | Discharge: 2016-12-14 | Disposition: A | Discharge status: Home / Self Care | Payer: 59 | Source: Home / Self Care

## 2016-12-14 DIAGNOSIS — J0120 Acute ethmoidal sinusitis, unspecified: Secondary | ICD-10-CM | POA: Diagnosis not present

## 2016-12-14 MED ORDER — AMOXICILLIN-POT CLAVULANATE 500-125 MG PO TABS: 1.0000 | ORAL_TABLET | Freq: Three times a day (TID) | ORAL | 0 refills | Status: DC

## 2016-12-14 NOTE — Discharge Instructions (Signed)
Return if any problems.

## 2016-12-14 NOTE — ED Provider Notes (Signed)
[facility]    CSN: [id number] Arrival date & time: 12/14/16  1330     History   Chief Complaint Chief Complaint  Patient presents with  . Nose Problem    HPI [name] is a 51 y.o. female.   Pt complains of sinus pressure and congestion.  Pt has had a cough and congestion.  Pt has had previous sinus infections.   The history is provided by the patient. No language interpreter was used.  Cough  Cough characteristics:  Non-productive Sputum characteristics:  Nondescript Severity:  Moderate Onset quality:  Gradual Duration:  2 weeks Timing:  Constant Progression:  Worsening Chronicity:  Recurrent Smoker: no   Context: upper respiratory infection   Relieved by:  Nothing Worsened by:  Nothing Ineffective treatments:  None tried Associated symptoms: sinus congestion   Risk factors: no recent travel     Past Medical History:  Diagnosis Date  . Cancer ([place])    NHL at 21  . Celiac disease   . Depression   . Family history of breast cancer   . Family history of colon cancer   . Family history of uterine cancer   . Fibromyalgia   . Folate deficiency 06/08/8117   Started on Folic Acid on 1/47/8295  . Hypothyroidism   . Hypothyroidism (acquired) 08/28/2014  . Iron deficiency anemia   . Low HDL (under 40)   . Memory difficulty 02/07/2013  . Migraine   . NHL (non-Hodgkin's lymphoma) ([place]) 07/08/2012   Stage IIA diffuse large B-cell lymphoma status post R-CHOP x6 cycles followed by involved field radiation by Dr. Nila Nephew and a mini mantle setup presenting here for the first time on 05/21/2000, finishing chemotherapy as of 09/22/2000, then radiation.    . Vitiligo     Patient Active Problem List   Diagnosis Date Noted  . Closed fracture of sesamoid bone of left foot 09/07/2016  . Right tennis elbow 09/03/2016  . Ingrown left greater toenail 06/30/2016  . Migraine without aura and without status migrainosus, not intractable 04/19/2016  .  Obesity (BMI 30-39.9) 04/19/2016  . DUB (dysfunctional uterine bleeding) 04/17/2016  . Autoimmune disorder ([place]) 09/06/2015  . Monocytosis 08/27/2015  . Chest pain on exertion 08/27/2015  . Fatigue 08/27/2015  . Vitamin D deficiency 08/27/2015  . B12 deficiency 08/27/2015  . Iron deficiency anemia 08/27/2015  . Generalized anxiety disorder 03/22/2015  . Insomnia 03/22/2015  . Genetic testing 10/05/2014  . Family history of breast cancer   . Family history of uterine cancer   . Family history of colon cancer   . Breast lesion on mammography 08/29/2014  . Family history of breast cancer in female 08/29/2014  . Hypothyroidism (acquired) 08/28/2014  . IBS (irritable bowel syndrome) 10/30/2013  . Tremor 08/08/2013  . Memory difficulty 02/07/2013  . Fibromyalgia 02/07/2013  . Vitiligo   . Thoracic spine pain 12/21/2012  . Celiac disease 11/16/2012  . History of systemic steroid therapy 11/16/2012  . Folate deficiency 07/09/2012  . History of non-Hodgkin's lymphoma 07/08/2012  . Hypothyroidism 07/06/2012  . Upper back pain on right side 06/06/2012  . Neuropathic pain 06/06/2012    Past Surgical History:  Procedure Laterality Date  . BACK SURGERY    . BREAST BIOPSY     X 2 both benigh  . BREAST CYST ASPIRATION Left 10/02/2013  . BREAST EXCISIONAL BIOPSY Left 01/04/2007  . COLONOSCOPY    . ESOPHAGOGASTRODUODENOSCOPY    . INCONTINENCE SURGERY    . LUMBAR  DISC SURGERY     artificial disc in lumbar area  . TUBAL LIGATION      OB History    No data available       Home Medications    Prior to Admission medications   Medication Sig Start Date End Date Taking? Authorizing Provider  cetirizine (ZYRTEC) 10 MG tablet Take 10 mg by mouth daily.   Yes [provider]  dextromethorphan-guaiFENesin (MUCINEX DM) 30-600 MG 12hr tablet Take 1 tablet by mouth 2 (two) times daily.   Yes [provider]  ibuprofen (ADVIL,MOTRIN) 200 MG tablet Take 200 mg by mouth  every 6 (six) hours as needed.   Yes [provider]  amoxicillin-clavulanate (AUGMENTIN) 500-125 MG tablet Take 1 tablet (500 mg total) by mouth every 8 (eight) hours. 12/14/16   Fransico Meadow, PA-C  citalopram (CELEXA) 40 MG tablet Take 1 tablet (40 mg total) by mouth daily. 04/17/16   Breeback, Royetta Car, PA-C  levothyroxine (SYNTHROID, LEVOTHROID) 50 MCG tablet TAKE 1 TABLET(50 MCG) BY MOUTH DAILY BEFORE BREAKFAST 07/06/16   Gregor Hams, MD  meloxicam (MOBIC) 15 MG tablet Take 1 tablet (15 mg total) by mouth daily as needed for pain. 08/28/16   Breeback, Jade L, PA-C  SUMAtriptan (IMITREX) 50 MG tablet TAKE 1 TABLET BY MOUTH AT FIRST SIGN OF MIGRAINE. MAY REPEAT IN 2 HOURS IF HEADACHE PERSISTS OR RECURS. 04/17/16   Breeback, Jade L, PA-C  zolpidem (AMBIEN) 10 MG tablet TAKE 1 TABLET BY MOUTH AT BEDTIME 11/11/16   Donella Stade, PA-C    Family History Family History  Problem Relation Age of Onset  . Breast cancer Maternal Aunt 70       breast ca  . Lung cancer Maternal Grandmother   . Colon cancer Maternal Grandmother 53  . Breast cancer Mother 54       breast ca  . Uterine cancer Mother 70       uterine ca  . Lung cancer Mother   . Liver cancer Maternal Grandfather     Social History Social History  Substance Use Topics  . Smoking status: Never Smoker  . Smokeless tobacco: Never Used  . Alcohol use Yes     Comment: rarely-wine     Allergies   Avelox [moxifloxacin hcl in nacl]; Barley grass; Codeine; Soy allergy; and Wheat bran   Review of Systems Review of Systems  Respiratory: Positive for cough.   All other systems reviewed and are negative.    Physical Exam Triage Vital Signs ED Triage Vitals  Enc Vitals Group     BP 12/14/16 1347 107/74     Pulse Rate 12/14/16 1347 81     Resp --      Temp 12/14/16 1347 99 F (37.2 C)     Temp Source 12/14/16 1347 Oral     SpO2 12/14/16 1347 98 %     Weight 12/14/16 1348 169 lb (76.7 kg)     Height 12/14/16 1348  5' 3"  (1.6 m)     Head Circumference --      Peak Flow --      Pain Score 12/14/16 1348 5     Pain Loc --      Pain Edu? --      Excl. in [place]? --    No data found.   Updated Vital Signs BP 107/74 (BP Location: Left Arm)   Pulse 81   Temp 99 F (37.2 C) (Oral)   Ht 5'  3" (1.6 m)   Wt 169 lb (76.7 kg)   SpO2 98%   BMI 29.94 kg/m   Visual Acuity Right Eye Distance:   Left Eye Distance:   Bilateral Distance:    Right Eye Near:   Left Eye Near:    Bilateral Near:     Physical Exam  Constitutional: She is oriented to person, place, and time. She appears well-developed and well-nourished.  HENT:  Head: Normocephalic and atraumatic.  Mouth/Throat: Posterior oropharyngeal erythema present.  Tender maxillary sinuses,  Eyes: Pupils are equal, round, and reactive to light. Conjunctivae and EOM are normal.  Neck: Normal range of motion. Neck supple.  Cardiovascular: Normal rate.   Pulmonary/Chest: Effort normal.  Abdominal: Soft.  Musculoskeletal: Normal range of motion.  Neurological: She is alert and oriented to person, place, and time.  Skin: Skin is warm and dry.  Psychiatric: She has a normal mood and affect.     UC Treatments / Results  Labs (all labs ordered are listed, but only abnormal results are displayed) Labs Reviewed - No data to display  EKG  EKG Interpretation None       Radiology No results found.  Procedures Procedures (including critical care time)  Medications Ordered in UC Medications - No data to display   Initial Impression / Assessment and Plan / UC Course  I have reviewed the triage vital signs and the nursing notes.  Pertinent labs & imaging results that were available during my care of the patient were reviewed by me and considered in my medical decision making (see chart for details).       Final Clinical Impressions(s) / UC Diagnoses   Final diagnoses:  Acute ethmoidal sinusitis, recurrence not specified    New  Prescriptions New Prescriptions   AMOXICILLIN-CLAVULANATE (AUGMENTIN) 500-125 MG TABLET    Take 1 tablet (500 mg total) by mouth every 8 (eight) hours.     Controlled Substance Prescriptions [place] Controlled Substance Registry consulted? Not Applicable  An After Visit Summary was printed and given to the patient.    Fransico Meadow, [place] 12/14/16 1610

## 2016-12-14 NOTE — ED Triage Notes (Signed)
Patient had a cold about two weeks ago, now for about 5 days she has had increase nasal congestion, with yellow to green thick mucus, sinus headache, pain and pressure, cough, hoarseness.

## 2017-01-05 ENCOUNTER — Other Ambulatory Visit: Payer: Self-pay | Admitting: Physician Assistant

## 2017-01-26 ENCOUNTER — Other Ambulatory Visit: Payer: Self-pay | Admitting: *Deleted

## 2017-02-04 ENCOUNTER — Other Ambulatory Visit: Payer: Self-pay

## 2017-02-04 MED ORDER — LEVOTHYROXINE SODIUM 50 MCG PO TABS: 50.0000 ug | ORAL_TABLET | Freq: Every day | ORAL | 0 refills | Status: DC

## 2017-02-24 ENCOUNTER — Other Ambulatory Visit: Payer: Self-pay | Admitting: Physician Assistant

## 2017-03-02 DIAGNOSIS — C50919 Malignant neoplasm of unspecified site of unspecified female breast: Secondary | ICD-10-CM

## 2017-03-02 HISTORY — DX: Malignant neoplasm of unspecified site of unspecified female breast: C50.919

## 2017-03-09 ENCOUNTER — Ambulatory Visit (INDEPENDENT_AMBULATORY_CARE_PROVIDER_SITE_OTHER): Payer: 59 | Admitting: Sports Medicine

## 2017-03-09 ENCOUNTER — Encounter: Payer: Self-pay | Admitting: Sports Medicine

## 2017-03-09 DIAGNOSIS — R229 Localized swelling, mass and lump, unspecified: Secondary | ICD-10-CM

## 2017-03-09 NOTE — Progress Notes (Signed)
Subjective:    CC: Skin mass  HPI: For the past couple weeks this pleasant 52 year old female has noted a mass of the anterior aspect of her right shoulder, nontender, not pruritic, slightly growing.  I reviewed the past medical history, family history, social history, surgical history, and allergies today and no changes were needed.  Please see the problem list section below in epic for further details.  Past Medical History: Past Medical History:  Diagnosis Date  . Cancer ([place])    NHL at 15  . Celiac disease   . Depression   . Family history of breast cancer   . Family history of colon cancer   . Family history of uterine cancer   . Fibromyalgia   . Folate deficiency 05/21/252   Started on Folic Acid on 2/70/6237  . Hypothyroidism   . Hypothyroidism (acquired) 08/28/2014  . Iron deficiency anemia   . Low HDL (under 40)   . Memory difficulty 02/07/2013  . Migraine   . 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 Nila Nephew and a mini mantle setup presenting here for the first time on 05/21/2000, finishing chemotherapy as of 09/22/2000, then radiation.    . Vitiligo    Past Surgical History: Past Surgical History:  Procedure Laterality Date  . BACK SURGERY    . BREAST BIOPSY     X 2 both benigh  . BREAST CYST ASPIRATION Left 10/02/2013  . BREAST EXCISIONAL BIOPSY Left 01/04/2007  . COLONOSCOPY    . ESOPHAGOGASTRODUODENOSCOPY    . INCONTINENCE SURGERY    . LUMBAR DISC SURGERY     artificial disc in lumbar area  . TUBAL LIGATION     Social History: Social History   Socioeconomic History  . Marital status: Legally Separated    Spouse name: Morris  . Number of children: 2  . Years of education: 16  . Highest education level: None  Social Needs  . Financial resource strain: None  . Food insecurity - worry: None  . Food insecurity - inability:  None  . Transportation needs - medical: None  . Transportation needs - non-medical: None  Occupational History    Employer: UNEMPLOYED  Tobacco Use  . Smoking status: Never Smoker  . Smokeless tobacco: Never Used  Substance and Sexual Activity  . Alcohol use: Yes    Comment: rarely-wine  . Drug use: No  . Sexual activity: Yes  Other Topics Concern  . None  Social History Narrative   Patient is married (Morris) and lives at home with her husband and her son.   Patient has two children.   Patient is currently not working.   Patient has a high school education.   Patient is right-handed.   Patient drinks two cups of coffee daily.   Family History: Family History  Problem Relation Age of Onset  . Breast cancer Maternal Aunt 70       breast ca  . Lung cancer Maternal Grandmother   . Colon cancer Maternal Grandmother 72  . Breast cancer Mother 15       breast ca  . Uterine cancer Mother 49       uterine ca  . Lung cancer Mother   . Liver cancer Maternal Grandfather    Allergies: Allergies  Allergen Reactions  . Avelox [Moxifloxacin Hcl In Nacl] Nausea Only  . Barley Grass   . Codeine Nausea  And Vomiting  . Soy Allergy     Per patient Dr. Carlean Purl told her she should also state that she could have a soy allergy as soy and other products are grown in alternate crops and could have cross contamination. No symptoms reported.  . Wheat Bran Other (See Comments)    Damages intestines, due to Celiac disease, [place] and Barley   Medications: See med rec.  Review of Systems: No fevers, chills, night sweats, weight loss, chest pain, or shortness of breath.   Objective:    General: Well Developed, well nourished, and in no acute distress.  Neuro: Alert and oriented x3, extra-ocular muscles intact, sensation grossly intact.  HEENT: Normocephalic, atraumatic, pupils equal round reactive to light, neck supple, no masses, no lymphadenopathy, thyroid nonpalpable.  Skin: Warm and dry, no  rashes.  There is a 1-2 cm well-defined, subcutaneous mass on the right anterior shoulder consistent with a sebaceous cyst. Cardiac: Regular rate and rhythm, no murmurs rubs or gallops, no lower extremity edema.  Respiratory: Clear to auscultation bilaterally. Not using accessory muscles, speaking in full sentences.  Impression and Recommendations:    Skin mass In the right anterior axilla, I do suspect this is a sebaceous cyst. She has not noted any breast lumps, she is going to repeat her mammogram just to be up-to-date. I would like to see her back later this week or next week and a 30-minute slot for a full excision.  I spent 25 minutes with this patient, greater than 50% was face-to-face time counseling regarding the above diagnoses ___________________________________________ Gwen Her. Dianah Field, M.D., ABFM., CAQSM. Primary Care and Outlook Instructor of [place] [facility] [employer]

## 2017-03-09 NOTE — Patient Instructions (Signed)
Epidermal Cyst An epidermal cyst is sometimes called an epidermal inclusion cyst or an infundibular cyst. It is a sac made of skin tissue. The sac contains a substance called keratin. Keratin is a protein that is normally secreted through the hair follicles. When keratin becomes trapped in the top layer of skin (epidermis), it can form an epidermal cyst. Epidermal cysts are usually found on the face, neck, trunk, and genitals. These cysts are usually harmless (benign), and they may not cause symptoms unless they become infected. It is important not to pop epidermal cysts yourself. What are the causes? This condition may be caused by:  A blocked hair follicle.  A hair that curls and re-enters the skin instead of growing straight out of the skin (ingrown hair).  A blocked pore.  Irritated skin.  An injury to the skin.  Certain conditions that are passed along from parent to child (inherited).  Human papillomavirus (HPV).  What increases the risk? The following factors may make you more likely to develop an epidermal cyst:  Having acne.  Being overweight.  Wearing tight clothing.  What are the signs or symptoms? The only symptom of this condition may be a small, painless lump underneath the skin. When an epidermal cyst becomes infected, symptoms may include:  Redness.  Inflammation.  Tenderness.  Warmth.  Fever.  Keratin draining from the cyst. Keratin may look like a grayish-white, bad-smelling substance.  Pus draining from the cyst.  How is this diagnosed? This condition is diagnosed with a physical exam. In some cases, you may have a sample of tissue (biopsy) taken from your cyst to be examined under a microscope or tested for bacteria. You may be referred to a health care provider who specializes in skin care (dermatologist). How is this treated? In many cases, epidermal cysts go away on their own without treatment. If a cyst becomes infected, treatment may  include:  Opening and draining the cyst. After draining, minor surgery to remove the rest of the cyst may be done.  Antibiotic medicine to help prevent infection.  Injections of medicines (steroids) that help to reduce inflammation.  Surgery to remove the cyst. Surgery may be done if: ? The cyst becomes large. ? The cyst bothers you. ? There is a chance that the cyst could turn into cancer.  Follow these instructions at home:  Take over-the-counter and prescription medicines only as told by your health care provider.  If you were prescribed an antibiotic, use it as told by your health care provider. Do not stop using the antibiotic even if you start to feel better.  Keep the area around your cyst clean and dry.  Wear loose, dry clothing.  Do not try to pop your cyst.  Avoid touching your cyst.  Check your cyst every day for signs of infection.  Keep all follow-up visits as told by your health care provider. This is important. How is this prevented?  Wear clean, dry, clothing.  Avoid wearing tight clothing.  Keep your skin clean and dry. Shower or take baths every day.  Wash your body with a benzoyl peroxide wash when you shower or bathe. Contact a health care provider if:  Your cyst develops symptoms of infection.  Your condition is not improving or is getting worse.  You develop a cyst that looks different from other cysts you have had.  You have a fever. Get help right away if:  Redness spreads from the cyst into the surrounding area. This information is   not intended to replace advice given to you by your health care provider. Make sure you discuss any questions you have with your health care provider. Document Released: 01/18/2004 Document Revised: 10/16/2015 Document Reviewed: 12/19/2014 Elsevier Interactive Patient Education  2018 Elsevier Inc.  

## 2017-03-09 NOTE — Assessment & Plan Note (Signed)
In the right anterior axilla, I do suspect this is a sebaceous cyst. She has not noted any breast lumps, she is going to repeat her mammogram just to be up-to-date. I would like to see her back later this week or next week and a 30-minute slot for a full excision.

## 2017-03-16 ENCOUNTER — Ambulatory Visit: Payer: 59 | Admitting: Sports Medicine

## 2017-03-17 ENCOUNTER — Encounter: Payer: Self-pay | Admitting: Sports Medicine

## 2017-03-17 ENCOUNTER — Other Ambulatory Visit: Payer: Self-pay | Admitting: Physician Assistant

## 2017-03-17 ENCOUNTER — Other Ambulatory Visit: Payer: Self-pay

## 2017-03-17 ENCOUNTER — Ambulatory Visit (INDEPENDENT_AMBULATORY_CARE_PROVIDER_SITE_OTHER): Payer: 59 | Admitting: Sports Medicine

## 2017-03-17 DIAGNOSIS — R229 Localized swelling, mass and lump, unspecified: Secondary | ICD-10-CM

## 2017-03-17 DIAGNOSIS — F5101 Primary insomnia: Secondary | ICD-10-CM

## 2017-03-17 MED ORDER — HYDROCODONE-ACETAMINOPHEN 5-325 MG PO TABS: 1.0000 | ORAL_TABLET | Freq: Three times a day (TID) | ORAL | 0 refills | Status: DC | PRN

## 2017-03-17 MED ORDER — ZOLPIDEM TARTRATE 10 MG PO TABS: 10.0000 mg | ORAL_TABLET | Freq: Every day | ORAL | 0 refills | Status: DC

## 2017-03-17 NOTE — Addendum Note (Signed)
Addended by: Elizabeth Sauer on: 03/17/2017 12:14 PM   Modules accepted: Orders

## 2017-03-17 NOTE — Patient Instructions (Signed)
Incision Care, Adult An incision is a surgical cut that is made through your skin. Most incisions are closed after surgery. Your incision may be closed with stitches (sutures), staples, skin glue, or adhesive strips. You may need to return to your health care provider to have sutures or staples removed. This may occur several days to several weeks after your surgery. The incision needs to be cared for properly to prevent infection. How to care for your incision Incision care   Follow instructions from your health care provider about how to take care of your incision. Make sure you: ? Wash your hands with soap and water before you change the bandage (dressing). If soap and water are not available, use hand sanitizer. ? Change your dressing as told by your health care provider. ? Leave sutures, skin glue, or adhesive strips in place. These skin closures may need to stay in place for 2 weeks or longer. If adhesive strip edges start to loosen and curl up, you may trim the loose edges. Do not remove adhesive strips completely unless your health care provider tells you to do that.  Check your incision area every day for signs of infection. Check for: ? More redness, swelling, or pain. ? More fluid or blood. ? Warmth. ? Pus or a bad smell.  Ask your health care provider how to clean the incision. This may include: ? Using mild soap and water. ? Using a clean towel to pat the incision dry after cleaning it. ? Applying a cream or ointment. Do this only as told by your health care provider. ? Covering the incision with a clean dressing.  Ask your health care provider when you can leave the incision uncovered.  Do not take baths, swim, or use a hot tub until your health care provider approves. Ask your health care provider if you can take showers. You may only be allowed to take sponge baths for bathing. Medicines  If you were prescribed an antibiotic medicine, cream, or ointment, take or apply the  antibiotic as told by your health care provider. Do not stop taking or applying the antibiotic even if your condition improves.  Take over-the-counter and prescription medicines only as told by your health care provider. General instructions  Limit movement around your incision to improve healing. ? Avoid straining, lifting, or exercise for the first month, or for as long as told by your health care provider. ? Follow instructions from your health care provider about returning to your normal activities. ? Ask your health care provider what activities are safe.  Protect your incision from the sun when you are outside for the first 6 months, or for as long as told by your health care provider. Apply sunscreen around the scar or cover it up.  Keep all follow-up visits as told by your health care provider. This is important. Contact a health care provider if:  Your have more redness, swelling, or pain around the incision.  You have more fluid or blood coming from the incision.  Your incision feels warm to the touch.  You have pus or a bad smell coming from the incision.  You have a fever or shaking chills.  You are nauseous or you vomit.  You are dizzy.  Your sutures or staples come undone. Get help right away if:  You have a red streak coming from your incision.  Your incision bleeds through the dressing and the bleeding does not stop with gentle pressure.  The edges of   your incision open up and separate.  You have severe pain.  You have a rash.  You are confused.  You faint.  You have trouble breathing and a fast heartbeat. This information is not intended to replace advice given to you by your health care provider. Make sure you discuss any questions you have with your health care provider. Document Released: 09/05/2004 Document Revised: 10/25/2015 Document Reviewed: 09/04/2015 Elsevier Interactive Patient Education  2018 Elsevier Inc.  

## 2017-03-17 NOTE — Assessment & Plan Note (Addendum)
Full surgical excision with running subcuticular primary closure, return in 1 week for wound check. Hydrocodone for postprocedural pain. Awaiting dermpath.

## 2017-03-17 NOTE — Progress Notes (Signed)
   Procedure:  Excision of right axillary skin mass, 2 cm Risks, benefits, and alternatives explained and consent obtained. Time out conducted. Surface prepped with alcohol. 7cc lidocaine with epinephine infiltrated in a field block. Adequate anesthesia ensured. Area prepped and draped in a sterile fashion. Excision performed with: Using a #11 blade I made an elliptical incision around the subcutaneous skin mass, there were several overlying venous structures, using both sharp and blunt dissection I avoided the venous structures and carried the dissection into the subcutaneous tissues, I removed the mass en bloc encased in the skin and closed the incision with a 4-0 running subcuticular Vicryl suture. Hemostasis achieved. Pt stable.

## 2017-03-22 ENCOUNTER — Telehealth: Payer: Self-pay

## 2017-03-22 NOTE — Telephone Encounter (Signed)
Excellent, thanks.

## 2017-03-22 NOTE — Telephone Encounter (Signed)
Patient called req skin biopsy results  - advised of PCP notations.  Patient states she is doing good - no pain at all. Patient states she did not have to take any pain meds either.

## 2017-03-25 ENCOUNTER — Ambulatory Visit: Payer: 59 | Admitting: Sports Medicine

## 2017-04-02 ENCOUNTER — Other Ambulatory Visit: Payer: Self-pay | Admitting: Physician Assistant

## 2017-04-02 DIAGNOSIS — G43009 Migraine without aura, not intractable, without status migrainosus: Secondary | ICD-10-CM

## 2017-04-02 DIAGNOSIS — F411 Generalized anxiety disorder: Secondary | ICD-10-CM

## 2017-04-07 ENCOUNTER — Other Ambulatory Visit: Payer: Self-pay | Admitting: Obstetrics and Gynecology

## 2017-04-07 DIAGNOSIS — Z1231 Encounter for screening mammogram for malignant neoplasm of breast: Secondary | ICD-10-CM

## 2017-04-12 ENCOUNTER — Other Ambulatory Visit: Payer: Self-pay | Admitting: Family Medicine

## 2017-05-03 ENCOUNTER — Ambulatory Visit
Admission: RE | Admit: 2017-05-03 | Discharge: 2017-05-03 | Disposition: A | Discharge status: Home / Self Care | Payer: 59 | Source: Ambulatory Visit | Attending: Obstetrics and Gynecology | Admitting: Obstetrics and Gynecology

## 2017-05-03 DIAGNOSIS — Z1231 Encounter for screening mammogram for malignant neoplasm of breast: Secondary | ICD-10-CM

## 2017-05-04 ENCOUNTER — Other Ambulatory Visit: Payer: Self-pay | Admitting: Obstetrics and Gynecology

## 2017-05-04 DIAGNOSIS — R928 Other abnormal and inconclusive findings on diagnostic imaging of breast: Secondary | ICD-10-CM

## 2017-05-06 ENCOUNTER — Ambulatory Visit
Admission: RE | Admit: 2017-05-06 | Discharge: 2017-05-06 | Disposition: A | Discharge status: Home / Self Care | Payer: 59 | Source: Ambulatory Visit | Attending: Obstetrics and Gynecology | Admitting: Obstetrics and Gynecology

## 2017-05-06 ENCOUNTER — Other Ambulatory Visit: Payer: Self-pay | Admitting: Obstetrics and Gynecology

## 2017-05-06 ENCOUNTER — Ambulatory Visit: Payer: 59

## 2017-05-06 DIAGNOSIS — R928 Other abnormal and inconclusive findings on diagnostic imaging of breast: Secondary | ICD-10-CM

## 2017-05-06 DIAGNOSIS — R921 Mammographic calcification found on diagnostic imaging of breast: Secondary | ICD-10-CM

## 2017-05-10 ENCOUNTER — Ambulatory Visit
Admission: RE | Admit: 2017-05-10 | Discharge: 2017-05-10 | Disposition: A | Discharge status: Home / Self Care | Payer: 59 | Source: Ambulatory Visit | Attending: Obstetrics and Gynecology | Admitting: Obstetrics and Gynecology

## 2017-05-10 ENCOUNTER — Other Ambulatory Visit: Payer: Self-pay | Admitting: Obstetrics and Gynecology

## 2017-05-10 DIAGNOSIS — R921 Mammographic calcification found on diagnostic imaging of breast: Secondary | ICD-10-CM

## 2017-05-11 ENCOUNTER — Other Ambulatory Visit: Payer: Self-pay | Admitting: *Deleted

## 2017-05-11 ENCOUNTER — Other Ambulatory Visit: Payer: Self-pay | Admitting: Obstetrics and Gynecology

## 2017-05-11 DIAGNOSIS — C50912 Malignant neoplasm of unspecified site of left female breast: Secondary | ICD-10-CM

## 2017-05-11 DIAGNOSIS — F411 Generalized anxiety disorder: Secondary | ICD-10-CM

## 2017-05-11 MED ORDER — CITALOPRAM HYDROBROMIDE 40 MG PO TABS
40.0000 mg | ORAL_TABLET | Freq: Every day | ORAL | 0 refills | Status: DC
Start: 2017-05-11 — End: 2017-06-09

## 2017-05-12 ENCOUNTER — Other Ambulatory Visit: Payer: 59

## 2017-05-13 ENCOUNTER — Telehealth: Payer: Self-pay

## 2017-05-13 NOTE — Telephone Encounter (Signed)
Called office regarding referral for patient. She already has appt with Dr. Tressie Stalker. Faxed last office note to there office [phone].

## 2017-05-14 ENCOUNTER — Ambulatory Visit
Admission: RE | Admit: 2017-05-14 | Discharge: 2017-05-14 | Disposition: A | Discharge status: Home / Self Care | Payer: 59 | Source: Ambulatory Visit | Attending: Obstetrics and Gynecology | Admitting: Obstetrics and Gynecology

## 2017-05-14 ENCOUNTER — Ambulatory Visit: Payer: Self-pay | Admitting: General Surgery

## 2017-05-14 DIAGNOSIS — C50912 Malignant neoplasm of unspecified site of left female breast: Secondary | ICD-10-CM

## 2017-05-14 DIAGNOSIS — D0512 Intraductal carcinoma in situ of left breast: Secondary | ICD-10-CM

## 2017-05-17 ENCOUNTER — Telehealth: Payer: Self-pay | Admitting: *Deleted

## 2017-05-17 ENCOUNTER — Telehealth: Payer: Self-pay | Admitting: Genetic Counselor

## 2017-05-17 NOTE — Telephone Encounter (Signed)
Looks like Dr. Excell Seltzer is involved from general surgery. Her last message indicated she wants to follow with Dr. Samantha Crimes, please assist in navigation

## 2017-05-17 NOTE — Telephone Encounter (Signed)
An urgent genetic counseling appt has been scheduled for the pt to see Steele Berg on 3/21 at 11am. Pt has agreed to the appt date and time.

## 2017-05-17 NOTE — Telephone Encounter (Signed)
"  Calling to schedule appointment with a breast specialist.  This past Tuesday the [name] told me I have breast cancer according to my biopsy results.  Already a patient but now I need and prefer to see a breast specialist.  Return number [phone]."  Anxiously asking several questions.  "Do I need to contact breast surgeon for mastectomy, a plastic surgeon or do you all have these?  How long will it take to get an appointment or a return call?  I'd like to be seen as soon as possible."

## 2017-05-18 DIAGNOSIS — C50419 Malignant neoplasm of upper-outer quadrant of unspecified female breast: Secondary | ICD-10-CM | POA: Insufficient documentation

## 2017-05-18 DIAGNOSIS — Z170 Estrogen receptor positive status [ER+]: Secondary | ICD-10-CM

## 2017-05-19 ENCOUNTER — Telehealth: Payer: Self-pay | Admitting: Genetics

## 2017-05-19 DIAGNOSIS — C50912 Malignant neoplasm of unspecified site of left female breast: Secondary | ICD-10-CM | POA: Diagnosis not present

## 2017-05-19 DIAGNOSIS — Z124 Encounter for screening for malignant neoplasm of cervix: Secondary | ICD-10-CM | POA: Diagnosis not present

## 2017-05-19 LAB — BASIC METABOLIC PANEL
BUN: 20 (ref 4–21)
Creatinine: 0.8 (ref 0.5–1.1)
GLUCOSE: 82
POTASSIUM: 4.6 (ref 3.4–5.3)
Sodium: 141 (ref 137–147)

## 2017-05-19 LAB — HEPATIC FUNCTION PANEL
ALT: 28 (ref 7–35)
AST: 28 (ref 13–35)
Alkaline Phosphatase: 64 (ref 25–125)

## 2017-05-19 LAB — TSH: TSH: 4.04 (ref 0.41–5.90)

## 2017-05-19 LAB — VITAMIN D 25 HYDROXY (VIT D DEFICIENCY, FRACTURES): VIT D 25 HYDROXY: 28.7

## 2017-05-19 NOTE — Telephone Encounter (Signed)
Informed patient that we had records she already had genetic testing, comprensive cancer panel, in July 2016.  This analyzed Breast, gyn, and GI cancer risk genes.  She therefore does not need to come to see genetic again as she already had panel testing.  We cancelled this appointment for her, and she requested these results be sent to her oncologist at [facility], Dr. Everardo All.

## 2017-05-20 ENCOUNTER — Other Ambulatory Visit: Payer: 59

## 2017-05-26 DIAGNOSIS — R87619 Unspecified abnormal cytological findings in specimens from cervix uteri: Secondary | ICD-10-CM | POA: Diagnosis not present

## 2017-05-26 DIAGNOSIS — B977 Papillomavirus as the cause of diseases classified elsewhere: Secondary | ICD-10-CM | POA: Diagnosis not present

## 2017-05-26 DIAGNOSIS — R87810 Cervical high risk human papillomavirus (HPV) DNA test positive: Secondary | ICD-10-CM | POA: Diagnosis not present

## 2017-06-01 ENCOUNTER — Other Ambulatory Visit: Payer: Self-pay | Admitting: Physician Assistant

## 2017-06-01 DIAGNOSIS — F411 Generalized anxiety disorder: Secondary | ICD-10-CM

## 2017-06-08 ENCOUNTER — Telehealth: Payer: Self-pay | Admitting: Physician Assistant
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
## 2017-06-09 ENCOUNTER — Other Ambulatory Visit: Payer: Self-pay | Admitting: Physician Assistant

## 2017-06-09 ENCOUNTER — Telehealth: Payer: Self-pay | Admitting: Family Medicine

## 2017-06-09 DIAGNOSIS — F411 Generalized anxiety disorder: Secondary | ICD-10-CM

## 2017-06-09 MED ORDER — ALPRAZOLAM 0.5 MG PO TABS: 0.2500 mg | ORAL_TABLET | Freq: Two times a day (BID) | ORAL | 0 refills | Status: DC | PRN

## 2017-06-09 MED ORDER — CITALOPRAM HYDROBROMIDE 40 MG PO TABS: 40.0000 mg | ORAL_TABLET | Freq: Every day | ORAL | 0 refills | Status: DC

## 2017-06-09 NOTE — Telephone Encounter (Signed)
Pt would like to switch her primary care from [facility] to dr. Georgina Snell. She has stated that her son and husband Radio broadcast assistant) are primary care patients of Dr. Clovis Riley. She has seen Georgina Snell for ortho several times and would like to see him for primary care as well. Would this be ok?

## 2017-06-09 NOTE — Telephone Encounter (Signed)
Will call patient to let her know. Thanks

## 2017-06-09 NOTE — Telephone Encounter (Signed)
I refilled celexa 15 tablets and xanax 10 tablets. I have not seen her in 04/2016. I understand she is going through a lot and does need to be seen. I was told she wanted to switch PCP to Dr. Georgina Snell. I am ok with this if Dr. Georgina Snell is.

## 2017-06-09 NOTE — Telephone Encounter (Signed)
Switch ok

## 2017-06-10 ENCOUNTER — Ambulatory Visit (INDEPENDENT_AMBULATORY_CARE_PROVIDER_SITE_OTHER): Payer: 59 | Admitting: Family Medicine

## 2017-06-10 ENCOUNTER — Encounter: Payer: Self-pay | Admitting: Family Medicine

## 2017-06-10 VITALS — BP 121/74 | HR 85 | Ht 63.5 in | Wt 172.0 lb

## 2017-06-10 DIAGNOSIS — S39012A Strain of muscle, fascia and tendon of lower back, initial encounter: Secondary | ICD-10-CM | POA: Diagnosis not present

## 2017-06-10 DIAGNOSIS — E039 Hypothyroidism, unspecified: Secondary | ICD-10-CM | POA: Diagnosis not present

## 2017-06-10 DIAGNOSIS — Z6829 Body mass index (BMI) 29.0-29.9, adult: Secondary | ICD-10-CM | POA: Diagnosis not present

## 2017-06-10 DIAGNOSIS — M2580 Other specified joint disorders, unspecified joint: Secondary | ICD-10-CM

## 2017-06-10 DIAGNOSIS — S161XXA Strain of muscle, fascia and tendon at neck level, initial encounter: Secondary | ICD-10-CM | POA: Diagnosis not present

## 2017-06-10 DIAGNOSIS — Z170 Estrogen receptor positive status [ER+]: Secondary | ICD-10-CM

## 2017-06-10 DIAGNOSIS — C50419 Malignant neoplasm of upper-outer quadrant of unspecified female breast: Secondary | ICD-10-CM | POA: Diagnosis not present

## 2017-06-10 MED ORDER — CYCLOBENZAPRINE HCL 5 MG PO TABS: 5.0000 mg | ORAL_TABLET | Freq: Three times a day (TID) | ORAL | 1 refills | Status: DC | PRN

## 2017-06-10 NOTE — Patient Instructions (Addendum)
Thank you for coming in today. Use a heating pad.  Work on neck and back motion.  Pt will help try to get in a few sessions before your surgery.  Muscle relaxer may help at night.  Recheck with me after your surgery.,  Make sure your plastic surgeon is available for me to ask questions for post op care.    Lumbosacral Strain Lumbosacral strain is an injury that causes pain in the lower back (lumbosacral spine). This injury usually occurs from overstretching the muscles or ligaments along your spine. A strain can affect one or more muscles or cord-like tissues that connect bones to other bones (ligaments). What are the causes? This condition may be caused by:  A hard, direct hit (blow) to the back.  Excessive stretching of the lower back muscles. This may result from: ? A fall. ? Lifting something heavy. ? Repetitive movements such as bending or crouching.  What increases the risk? The following factors may increase your risk of getting this condition:  Participating in sports or activities that involve: ? A sudden twist of the back. ? Pushing or pulling motions.  Being overweight or obese.  Having poor strength and flexibility, especially tight hamstrings or weak muscles in the back or abdomen.  Having too much of a curve in the lower back.  Having a pelvis that is tilted forward.  What are the signs or symptoms? The main symptom of this condition is pain in the lower back, at the site of the strain. Pain may extend (radiate) down one or both legs. How is this diagnosed? This condition is diagnosed based on:  Your symptoms.  Your medical history.  A physical exam. ? Your health care provider may push on certain areas of your back to determine the source of your pain. ? You may be asked to bend forward, backward, and side to side to assess the severity of your pain and your range of motion.  Imaging tests, such as: ? X-rays. ? MRI.  How is this treated? Treatment  for this condition may include:  Putting heat and cold on the affected area.  Medicines to help relieve pain and relax your muscles (muscle relaxants).  NSAIDs to help reduce swelling and discomfort.  When your symptoms improve, it is important to gradually return to your normal routine as soon as possible to reduce pain, avoid stiffness, and avoid loss of muscle strength. Generally, symptoms should improve within 6 weeks of treatment. However, recovery time varies. Follow these instructions at home: Managing pain, stiffness, and swelling   If directed, put ice on the injured area during the first 24 hours after your strain. ? Put ice in a plastic bag. ? Place a towel between your skin and the bag. ? Leave the ice on for 20 minutes, 2-3 times a day.  If directed, put heat on the affected area as often as told by your health care provider. Use the heat source that your health care provider recommends, such as a moist heat pack or a heating pad. ? Place a towel between your skin and the heat source. ? Leave the heat on for 20-30 minutes. ? Remove the heat if your skin turns bright red. This is especially important if you are unable to feel pain, heat, or cold. You may have a greater risk of getting burned. Activity  Rest and return to your normal activities as told by your health care provider. Ask your health care provider what activities are safe  for you.  Avoid activities that take a lot of energy for as long as told by your health care provider. General instructions  Take over-the-counter and prescription medicines only as told by your health care provider.  Donot drive or use heavy machinery while taking prescription pain medicine.  Do not use any products that contain nicotine or tobacco, such as cigarettes and e-cigarettes. If you need help quitting, ask your health care provider.  Keep all follow-up visits as told by your health care provider. This is important. How is this  prevented?  Use correct form when playing sports and lifting heavy objects.  Use good posture when sitting and standing.  Maintain a healthy weight.  Sleep on a mattress with medium firmness to support your back.  Be safe and responsible while being active to avoid falls.  Do at least 150 minutes of moderate-intensity exercise each week, such as brisk walking or water aerobics. Try a form of exercise that takes stress off your back, such as swimming or stationary cycling.  Maintain physical fitness, including: ? Strength. ? Flexibility. ? Cardiovascular fitness. ? Endurance. Contact a health care provider if:  Your back pain does not improve after 6 weeks of treatment.  Your symptoms get worse. Get help right away if:  Your back pain is severe.  You cannot stand or walk.  You have difficulty controlling when you urinate or when you have a bowel movement.  You feel nauseous or you vomit.  Your feet get very cold.  You have numbness, tingling, weakness, or problems using your arms or legs.  You develop any of the following: ? Shortness of breath. ? Dizziness. ? Pain in your legs. ? Weakness in your buttocks or legs. ? Discoloration of the skin on your toes or legs. This information is not intended to replace advice given to you by your health care provider. Make sure you discuss any questions you have with your health care provider. Document Released: 11/26/2004 Document Revised: 09/06/2015 Document Reviewed: 07/21/2015 Elsevier Interactive Patient Education  [employer].
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
## 2017-06-11 DIAGNOSIS — D0512 Intraductal carcinoma in situ of left breast: Secondary | ICD-10-CM | POA: Diagnosis not present

## 2017-06-11 DIAGNOSIS — N6011 Diffuse cystic mastopathy of right breast: Secondary | ICD-10-CM | POA: Diagnosis not present

## 2017-06-16 ENCOUNTER — Encounter: Payer: Self-pay | Admitting: Family Medicine

## 2017-06-16 ENCOUNTER — Telehealth: Payer: Self-pay | Admitting: Family Medicine

## 2017-06-16 ENCOUNTER — Ambulatory Visit (INDEPENDENT_AMBULATORY_CARE_PROVIDER_SITE_OTHER): Payer: 59 | Admitting: Family Medicine

## 2017-06-16 VITALS — BP 99/66 | HR 70 | Ht 63.5 in | Wt 172.0 lb

## 2017-06-16 DIAGNOSIS — Z01818 Encounter for other preprocedural examination: Secondary | ICD-10-CM

## 2017-06-16 DIAGNOSIS — F411 Generalized anxiety disorder: Secondary | ICD-10-CM

## 2017-06-16 DIAGNOSIS — D508 Other iron deficiency anemias: Secondary | ICD-10-CM | POA: Diagnosis not present

## 2017-06-16 DIAGNOSIS — E039 Hypothyroidism, unspecified: Secondary | ICD-10-CM | POA: Diagnosis not present

## 2017-06-16 DIAGNOSIS — K900 Celiac disease: Secondary | ICD-10-CM

## 2017-06-16 LAB — CBC
HEMATOCRIT: 36.9 % (ref 35.0–45.0)
Hemoglobin: 12.6 g/dL (ref 11.7–15.5)
MCH: 30.9 pg (ref 27.0–33.0)
MCHC: 34.1 g/dL (ref 32.0–36.0)
MCV: 90.4 fL (ref 80.0–100.0)
MPV: 10.5 fL (ref 7.5–12.5)
PLATELETS: 410 10*3/uL — AB (ref 140–400)
RBC: 4.08 10*6/uL (ref 3.80–5.10)
RDW: 13.1 % (ref 11.0–15.0)
WBC: 8.6 10*3/uL (ref 3.8–10.8)

## 2017-06-16 LAB — POCT URINALYSIS DIPSTICK
Bilirubin, UA: NEGATIVE
GLUCOSE UA: NEGATIVE
Ketones, UA: NEGATIVE
LEUKOCYTES UA: NEGATIVE
Nitrite, UA: NEGATIVE
PH UA: 7 (ref 5.0–8.0)
Protein, UA: NEGATIVE
RBC UA: NEGATIVE
Spec Grav, UA: 1.015 (ref 1.010–1.025)
Urobilinogen, UA: 0.2 E.U./dL

## 2017-06-16 LAB — BASIC METABOLIC PANEL WITH GFR
BUN: 14 mg/dL (ref 7–25)
CALCIUM: 9.4 mg/dL (ref 8.6–10.4)
CHLORIDE: 102 mmol/L (ref 98–110)
CO2: 29 mmol/L (ref 20–32)
Creat: 0.72 mg/dL (ref 0.50–1.05)
GFR, Est African American: 112 mL/min/{1.73_m2} (ref >=60)
GFR, Est Non African American: 97 mL/min/{1.73_m2} (ref >=60)
Glucose, Bld: 88 mg/dL (ref 65–139)
POTASSIUM: 4.4 mmol/L (ref 3.5–5.3)
Sodium: 137 mmol/L (ref 135–146)

## 2017-06-16 MED ORDER — LEVOTHYROXINE SODIUM 50 MCG PO TABS: 50.0000 ug | ORAL_TABLET | Freq: Every day | ORAL | 1 refills | Status: DC

## 2017-06-16 MED ORDER — CITALOPRAM HYDROBROMIDE 40 MG PO TABS: 40.0000 mg | ORAL_TABLET | Freq: Every day | ORAL | 1 refills | Status: DC

## 2017-06-16 MED ORDER — ALPRAZOLAM 0.5 MG PO TABS: 0.2500 mg | ORAL_TABLET | Freq: Every evening | ORAL | 3 refills | Status: DC | PRN

## 2017-06-16 NOTE — Patient Instructions (Addendum)
Thank you for coming in today. Get labs today.  I will send this note and labs results and EKG to your surgeon.  Recheck with me when you get back probably in early May. Please bring with your all the notes.   Let me know if things change.

## 2017-06-16 NOTE — Telephone Encounter (Signed)
Pt has surgery scheduled on 06/23/17. I just received a letter from the surgeon stating that she needs CBC, BMP, Urinalysis and EKG before the 24th.  Please contact pt now and schedule asap.  OK to double book

## 2017-06-16 NOTE — Progress Notes (Signed)
Susan GUIRGUIS is a 52 y.o. female who presents to [facility] [place] today for preoperative risk factor evaluation.  [name] is scheduled for bilateral mastectomy with flap repair on April 24 as previously outlined.  I received a fax from her surgeon yesterday noting that she needs labs EKG and preoperative evaluation prior to surgery.  [name] is doing reasonably well.  She notes that she can climb 3 flights of stairs without any shortness of breath chest pain or palpitations.  She denies fevers or chills vomiting or diarrhea.  She had some blood work done last month just longer than the 30-day cut off outlined by her Psychologist, sport and exercise.   Past Medical History:  Diagnosis Date  . Cancer ([place])    NHL at 33  . Celiac disease   . Depression   . Family history of breast cancer   . Family history of colon cancer   . Family history of uterine cancer   . Fibromyalgia   . Folate deficiency 1/44/8185   Started on Folic Acid on 6/31/4970  . Hypothyroidism   . Hypothyroidism (acquired) 08/28/2014  . Iron deficiency anemia   . Low HDL (under 40)   . Memory difficulty 02/07/2013  . Migraine   . 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 Nila Nephew and a mini mantle setup presenting here for the first time on 05/21/2000, finishing chemotherapy as of 09/22/2000, then radiation.    . Vitiligo    Past Surgical History:  Procedure Laterality Date  . BACK SURGERY    . BREAST BIOPSY     X 2 both benigh  . BREAST CYST ASPIRATION Left 10/02/2013  . BREAST EXCISIONAL BIOPSY Left 01/04/2007  . COLONOSCOPY    . ESOPHAGOGASTRODUODENOSCOPY    . INCONTINENCE SURGERY    . LUMBAR DISC SURGERY     artificial disc in lumbar area  . TUBAL LIGATION     Social History   Tobacco Use  .  Smoking status: Never Smoker  . Smokeless tobacco: Never Used  Substance Use Topics  . Alcohol use: Yes    Comment: rarely-wine   family history includes Breast cancer (age of onset: 92) in her mother; Breast cancer (age of onset: 50) in her maternal aunt; Colon cancer (age of onset: 42) in her maternal grandmother; Liver cancer in her maternal grandfather; Lung cancer in her maternal grandmother and mother; Uterine cancer (age of onset: 18) in her mother.  ROS as above:  Medications: Current Outpatient Medications  Medication Sig Dispense Refill  . ALPRAZolam (XANAX) 0.5 MG tablet Take 0.5-1 tablets (0.25-0.5 mg total) by mouth at bedtime as needed. for anxiety 30 tablet 3  . citalopram (CELEXA) 40 MG tablet Take 1 tablet (40 mg total) by mouth daily. 90 tablet 1  . cyclobenzaprine (FLEXERIL) 5 MG tablet Take 1-2 tablets (5-10 mg total) by mouth 3 (three) times daily as needed for muscle spasms. 30 tablet 1  . HYDROcodone-acetaminophen (NORCO/VICODIN) 5-325 MG tablet Take 1 tablet by mouth every 8 (eight) hours as needed for moderate pain. 15 tablet 0  . levothyroxine (SYNTHROID, LEVOTHROID) 50 MCG tablet Take 1 tablet (50 mcg total) by mouth daily before breakfast. 90 tablet 1  . meloxicam (MOBIC) 15 MG tablet Take 1 tablet (15 mg total) by mouth daily as needed for pain. 30 tablet 1  .  SUMAtriptan (IMITREX) 50 MG tablet TAKE 1 TABLET BY MOUTH AT FIRST SIGN OF MIGRAINE, MAY REPEAT IN 2 HOURS IF HEADACHE PERSISTS OR RECURS 12 tablet 0   Current Facility-Administered Medications  Medication Dose Route Frequency Provider Last Rate Last Dose  . cyanocobalamin ((VITAMIN B-12)) injection 1,000 mcg  1,000 mcg Intramuscular Once Gatha Mayer, MD       Allergies  Allergen Reactions  . Avelox [Moxifloxacin Hcl In Nacl] Nausea Only  . Barley Grass   . Codeine Nausea And Vomiting  . Soy Allergy     Per patient Dr. Carlean Purl told her she should also state that she could have a soy allergy as soy  and other products are grown in alternate crops and could have cross contamination. No symptoms reported.  . Wheat Bran Other (See Comments)    Damages intestines, due to Celiac disease, Rye and Barley    Health Maintenance Health Maintenance  Topic Date Due  . HIV Screening  06/17/2018 (Originally 08/16/1980)  . INFLUENZA VACCINE  09/30/2017  . PAP SMEAR  10/25/2017  . MAMMOGRAM  05/07/2019  . TETANUS/TDAP  07/19/2020  . COLONOSCOPY  07/22/2022     Exam:  BP 99/66   Pulse 70   Ht 5' 3.5" (1.613 m)   Wt 172 lb (78 kg)   SpO2 98%   BMI 29.99 kg/m  Gen: Well NAD HEENT: EOMI,  MMM Lungs: Normal work of breathing. CTABL Heart: RRR no MRG Abd: NABS, Soft. Nondistended, Nontender Exts: Brisk capillary refill, warm and well perfused.   Twelve-lead EKG shows normal sinus rhythm at 79 ST segment elevation or depression.  Normal EKG.   Results for orders placed or performed in visit on 06/16/17 (from the past 72 hour(s))  POCT Urinalysis Dipstick     Status: None   Collection Time: 06/16/17 11:19 AM  Result Value Ref Range   Color, UA yellow    Clarity, UA clear    Glucose, UA negative    Bilirubin, UA negative    Ketones, UA negative    Spec Grav, UA 1.015 1.010 - 1.025   Blood, UA negative    pH, UA 7.0 5.0 - 8.0   Protein, UA negative    Urobilinogen, UA 0.2 0.2 or 1.0 E.U./dL   Nitrite, UA negative    Leukocytes, UA Negative Negative   Appearance     Odor     No results found.    Assessment and Plan: 52 y.o. female with  Preoperative evaluation.  Doing well.  Risk factors optimized for surgery.  CBC metabolic panel are pending.  EKG unremarkable.  Follow up after surgery. Chronic medications refilled.   CC: Estell Harpin FNP-C Fax: [phone] Phone: [phone] Cell [phone]   Orders Placed This Encounter  Procedures  . CBC  . BASIC METABOLIC PANEL WITH GFR  . POCT Urinalysis Dipstick  . EKG 12-Lead   Meds ordered this encounter    Medications  . citalopram (CELEXA) 40 MG tablet    Sig: Take 1 tablet (40 mg total) by mouth daily.    Dispense:  90 tablet    Refill:  1  . ALPRAZolam (XANAX) 0.5 MG tablet    Sig: Take 0.5-1 tablets (0.25-0.5 mg total) by mouth at bedtime as needed. for anxiety    Dispense:  30 tablet    Refill:  3  . levothyroxine (SYNTHROID, LEVOTHROID) 50 MCG tablet    Sig: Take 1 tablet (50 mcg total) by mouth daily before breakfast.  Dispense:  90 tablet    Refill:  1     Discussed warning signs or symptoms. Please see discharge instructions. Patient expresses understanding.  I spent 15 minutes with this patient, greater than 50% was face-to-face time counseling regarding treatment plan and post op plan and course.

## 2017-06-16 NOTE — Telephone Encounter (Signed)
Patient advised and scheduled.  

## 2017-06-17 ENCOUNTER — Encounter: Payer: Self-pay | Admitting: Physical Therapy

## 2017-06-17 ENCOUNTER — Ambulatory Visit (INDEPENDENT_AMBULATORY_CARE_PROVIDER_SITE_OTHER): Payer: 59 | Admitting: Physical Therapy

## 2017-06-17 DIAGNOSIS — M542 Cervicalgia: Secondary | ICD-10-CM

## 2017-06-17 DIAGNOSIS — M62830 Muscle spasm of back: Secondary | ICD-10-CM | POA: Diagnosis not present

## 2017-06-17 DIAGNOSIS — M545 Low back pain, unspecified: Secondary | ICD-10-CM
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
## 2017-06-17 NOTE — Patient Instructions (Addendum)
Decompression Exercise: Basic    Lie on back on firm surface, knees bent, feet flat, arms turned up, out to sides, backs of hands down. Time _5-10__ minutes. As needed for back pain Surface: floor   Head Press With Chin Tuck      Tuck chin SLIGHTLY toward chest, keep mouth closed. Feel weight on back of head. Increase weight by pressing head down. Hold __2-3_ seconds. Relax. Repeat _10__ times. Twice a day.  Surface: floor  Lower Trunk Rotation Stretch    Keeping back flat and feet together, rotate knees to left side. Hold __3-5__ seconds.Repeat to the other side. Repeat _10___ times per set. Do __1__ sets per session. Do _2___ sessions per day.  Knee-to-Chest Stretch: Unilateral - can have one knee bent with other knee pulled in.     With hand behind right knee, pull knee in to chest until a comfortable stretch is felt in lower back and buttocks. Keep back relaxed. Hold _20-30___ seconds. Repeat _1-2___ times per set. Do __1__ sets per session. Do __2__ sessions per day.   Knee-to-Chest Stretch: Bilateral    With hands behind knees, pull both knees in to chest until a comfortable stretch is felt in lower back and buttocks. Keep back relaxed. Hold __20-30__ seconds. Repeat _1-2___ times per set. Do __1__ sets per session. Do __2__ sessions per day.  Shoulder Press    Press both shoulders down. Hold _2-3__ seconds. Repeat __10_ times. Press one shoulder down. Hold _2-3__ seconds Repeat _10__ times. Do other shoulder. If unable to press one or both shoulders, lie in position a few sessions until you can. Twice a day.  Surface: floor  Cat / Cow Flow    Inhale, press spine toward ceiling like a Halloween cat. Keeping strength in arms and abdominals, exhale to soften spine through neutral and into cow pose. Open chest and arch back. Initiate movement between cat and cow at tailbone, one vertebrae at a time. Repeat _10___ times. Twice a day  BACK: Child's Pose  (Sciatica)    Sit in knee-chest position and reach arms forward. Separate knees for comfort. Hold position for _20-30__ secs. Repeat _1__ times. Do _2__ times per day.  Getting Into / Out of Bed    Lower self to lie down on one side by raising legs and lowering head at the same time. Use arms to assist moving without twisting. Bend both knees to roll onto back if desired. To sit up, start from lying on side, and use same move-ments in reverse. Keep trunk aligned with legs.   Log Roll    Lying on back, bend left knee and place left arm across chest. Roll all in one movement to the right. Reverse to roll to the left. Always move as one unit.  WALKING  Walking is a great form of exercise to increase your strength, endurance and overall fitness.  A walking program can help you start slowly and gradually build endurance as you go.  Everyone's ability is different, so each person's starting point will be different.  You do not have to follow them exactly.  The are just samples. You should simply find out what's right for you and stick to that program.   In the beginning, you'll start off walking 2-3 times a day for short distances.  As you get stronger, you'll be walking further at just 1-2 times per day.  A. You Can Walk For A Certain Length Of Time Each Day    Walk  5 minutes 3 times per day.  Increase 2 minutes every 2 days (3 times per day).  Work up to 25-30 minutes (1-2 times per day).   Example:   Day 1-2 5 minutes 3 times per day   Day 7-8 12 minutes 2-3 times per day   Day 13-14 25 minutes 1-2 times per day  B. You Can Walk For a Certain Distance Each Day     Distance can be substituted for time.    Example:   3 trips to mailbox (at road)   3 trips to corner of block   3 trips around the block  C. Go to local [employer] school and use the track.    Walk for distance ____ around track  Or time ____ minutes  D. Walk ____ Jog ____ Run ___  Please only do the exercises that  your therapist has initialed and dated

## 2017-06-21 DIAGNOSIS — D0512 Intraductal carcinoma in situ of left breast: Secondary | ICD-10-CM | POA: Diagnosis not present

## 2017-06-23 DIAGNOSIS — Z8572 Personal history of non-Hodgkin lymphomas: Secondary | ICD-10-CM | POA: Diagnosis not present

## 2017-06-23 DIAGNOSIS — D0512 Intraductal carcinoma in situ of left breast: Secondary | ICD-10-CM | POA: Diagnosis not present

## 2017-06-23 DIAGNOSIS — F329 Major depressive disorder, single episode, unspecified: Secondary | ICD-10-CM | POA: Diagnosis not present

## 2017-06-23 DIAGNOSIS — N6011 Diffuse cystic mastopathy of right breast: Secondary | ICD-10-CM | POA: Diagnosis not present

## 2017-06-23 DIAGNOSIS — F419 Anxiety disorder, unspecified: Secondary | ICD-10-CM | POA: Diagnosis not present

## 2017-06-23 DIAGNOSIS — Z923 Personal history of irradiation: Secondary | ICD-10-CM | POA: Diagnosis not present

## 2017-06-23 DIAGNOSIS — E039 Hypothyroidism, unspecified: Secondary | ICD-10-CM | POA: Diagnosis not present

## 2017-06-23 DIAGNOSIS — M797 Fibromyalgia: Secondary | ICD-10-CM | POA: Diagnosis not present

## 2017-06-23 DIAGNOSIS — Z79899 Other long term (current) drug therapy: Secondary | ICD-10-CM | POA: Diagnosis not present

## 2017-06-23 DIAGNOSIS — K900 Celiac disease: Secondary | ICD-10-CM | POA: Diagnosis not present

## 2017-06-23 HISTORY — PX: NIPPLE SPARING MASTECTOMY: SHX6537

## 2017-06-23 HISTORY — PX: OTHER SURGICAL HISTORY: SHX169

## 2017-06-24 ENCOUNTER — Encounter: Payer: Self-pay | Admitting: Internal Medicine

## 2017-07-08 ENCOUNTER — Encounter: Payer: Self-pay | Admitting: Family Medicine

## 2017-07-08 ENCOUNTER — Ambulatory Visit (INDEPENDENT_AMBULATORY_CARE_PROVIDER_SITE_OTHER): Payer: 59 | Admitting: Family Medicine

## 2017-07-08 VITALS — BP 100/63 | HR 80 | Wt 169.0 lb

## 2017-07-08 DIAGNOSIS — N6489 Other specified disorders of breast: Secondary | ICD-10-CM

## 2017-07-08 DIAGNOSIS — R229 Localized swelling, mass and lump, unspecified: Secondary | ICD-10-CM

## 2017-07-08 DIAGNOSIS — R234 Changes in skin texture: Secondary | ICD-10-CM

## 2017-07-08 MED ORDER — HYDROCODONE-ACETAMINOPHEN 5-325 MG PO TABS: 1.0000 | ORAL_TABLET | Freq: Three times a day (TID) | ORAL | 0 refills | Status: DC | PRN

## 2017-07-08 NOTE — Patient Instructions (Signed)
Thank you for coming in today. Recheck in 1-2 weeks.  Return sooner if needed.    Seroma A seroma is a collection of fluid on the body that looks like swelling or a mass. Seromas form where tissue has been injured or cut. Seromas vary in size. Some are small and painless. Others may become large and cause pain or discomfort. Many seromas go away on their own as the fluid is naturally absorbed by the body, and some seromas need to be drained. What are the causes? Seromas form as the result of damage to tissue or the removal of tissue. This tissue damage may occur during surgery or because of an injury or trauma. When tissue is disrupted or removed, empty space is created. The body's natural defense system (immune system) causes fluid to enter the empty space and form a seroma. What are the signs or symptoms? Symptoms of this condition include:  Swelling at the site of a surgical cut (incision) or an injury.  Drainage of clear fluid at the surgery or injury site.  Discomfort or pain.  How is this diagnosed? This condition is diagnosed based on your symptoms, your medical history, and a physical exam. During the exam, your health care provider will press on the seroma. You may also have tests, including:  Blood tests.  Imaging tests, such as an ultrasound or CT scan.  How is this treated? Some seromas go away (resolve) on their own. Your health care provider may monitor you to make sure the seroma does not cause any complications. If your seroma does not resolve on its own, treatment may include:  Using a needle to drain the fluid from the seroma (needle aspiration).  Inserting a flexible tube (catheter) to drain the fluid.  Applying a bandage (dressing), such as an elastic bandage or binder.  Antibiotic medicines, if the seroma becomes infected.  In rare cases, surgery may be done to remove the seroma and repair the area. Follow these instructions at home:  If you were prescribed  an antibiotic medicine, take it as told by your health care provider. Do not stop taking the antibiotic even if you start to feel better.  Return to your normal activities as told by your health care provider. Ask your health care provider what activities are safe for you.  Take over-the-counter and prescription medicines only as told by your health care provider.  Check your seroma every day for signs of infection. Check for: ? Redness or pain. ? Fluid or pus. ? More swelling. ? Warmth.  Keep all follow-up visits as told by your health care provider. This is important. Contact a health care provider if:  You have a fever.  You have redness or pain at the site of the seroma.  You have fluid or pus coming from the seroma.  Your seroma is more swollen or is getting bigger.  Your seroma is warm to the touch. This information is not intended to replace advice given to you by your health care provider. Make sure you discuss any questions you have with your health care provider. Document Released: 06/13/2012 Document Revised: 11/29/2015 Document Reviewed: 11/29/2015 Elsevier Interactive Patient Education  [employer].

## 2017-07-09 DIAGNOSIS — N6489 Other specified disorders of breast: Secondary | ICD-10-CM | POA: Insufficient documentation

## 2017-07-09 NOTE — Progress Notes (Signed)
[name] is a 52 y.o. female who presents to [place]: [place] today for breast drainage.  [place]  She had some tissue necrosis at the left breast flap which she is been treated with Silvadene cream prescribed by her plastic surgery team.  She is feeling pretty well.  Yesterday she developed some reddish watery discharge from her left breast.  She denies severe pain fevers or chills.  She notes that she feels pretty well overall.   Past Medical History:  Diagnosis Date  . Cancer ([place])    NHL at 16  . Celiac disease   . Depression   . Family history of breast cancer   . Family history of colon cancer   . Family history of uterine cancer   . Fibromyalgia   . Folate deficiency 07/28/4130   Started on Folic Acid on 4/40/1027  . Hypothyroidism   . Hypothyroidism (acquired) 08/28/2014  . Iron deficiency anemia   . Low HDL (under 40)   . Memory difficulty 02/07/2013  . Migraine   . NHL (non-Hodgkin's lymphoma) ([place]) 07/08/2012   Stage IIA diffuse large B-cell lymphoma status post R-CHOP x6 cycles followed by involved field radiation by Dr. Nila Nephew and a mini mantle setup presenting here for the first time on 05/21/2000, finishing chemotherapy as of 09/22/2000, then radiation.    . Vitiligo    Past Surgical History:  Procedure Laterality Date  . BACK SURGERY    . BREAST BIOPSY     X 2 both benigh  . BREAST CYST ASPIRATION Left 10/02/2013  . BREAST EXCISIONAL BIOPSY Left 01/04/2007  . COLONOSCOPY    . ESOPHAGOGASTRODUODENOSCOPY    . INCONTINENCE SURGERY    . LUMBAR DISC SURGERY     artificial disc in lumbar area  . TUBAL LIGATION     Social History   Tobacco Use  . Smoking status: Never Smoker  . Smokeless tobacco: Never Used  Substance Use Topics  . Alcohol use: Yes    Comment:  rarely-wine   family history includes Breast cancer (age of onset: 52) in her mother; Breast cancer (age of onset: 33) in her maternal aunt; Colon cancer (age of onset: 23) in her maternal grandmother; Liver cancer in her maternal grandfather; Lung cancer in her maternal grandmother and mother; Uterine cancer (age of onset: 43) in her mother.  ROS as above:  Medications: Current Outpatient Medications  Medication Sig Dispense Refill  . ALPRAZolam (XANAX) 0.5 MG tablet Take 0.5-1 tablets (0.25-0.5 mg total) by mouth at bedtime as needed. for anxiety 30 tablet 3  . citalopram (CELEXA) 40 MG tablet Take 1 tablet (40 mg total) by mouth daily. 90 tablet 1  . cyclobenzaprine (FLEXERIL) 5 MG tablet Take 1-2 tablets (5-10 mg total) by mouth 3 (three) times daily as needed for muscle spasms. 30 tablet 1  . HYDROcodone-acetaminophen (NORCO/VICODIN) 5-325 MG tablet Take 1 tablet by mouth every 8 (eight) hours as needed for moderate pain. 15 tablet 0  . levothyroxine (SYNTHROID, LEVOTHROID) 50 MCG tablet Take 1 tablet (50 mcg total) by mouth daily before breakfast. 90 tablet 1  . meloxicam (MOBIC) 15 MG tablet Take 1 tablet (15 mg total) by mouth daily as needed for pain. 30 tablet 1  . SUMAtriptan (IMITREX) 50 MG tablet TAKE 1 TABLET BY MOUTH AT FIRST SIGN OF MIGRAINE, MAY REPEAT IN 2 HOURS  IF HEADACHE PERSISTS OR RECURS 12 tablet 0   Current Facility-Administered Medications  Medication Dose Route Frequency Provider Last Rate Last Dose  . cyanocobalamin ((VITAMIN B-12)) injection 1,000 mcg  1,000 mcg Intramuscular Once Gatha Mayer, MD       Allergies  Allergen Reactions  . Avelox [Moxifloxacin Hcl In Nacl] Nausea Only  . Barley Grass   . Codeine Nausea And Vomiting  . Soy Allergy     Per patient Dr. Carlean Purl told her she should also state that she could have a soy allergy as soy and other products are grown in alternate crops and could have cross contamination. No symptoms reported.  . Wheat  Bran Other (See Comments)    Damages intestines, due to Celiac disease, [place] and Barley    Health Maintenance Health Maintenance  Topic Date Due  . HIV Screening  06/17/2018 (Originally 08/16/1980)  . INFLUENZA VACCINE  09/30/2017  . PAP SMEAR  10/25/2017  . MAMMOGRAM  05/07/2019  . TETANUS/TDAP  07/19/2020  . COLONOSCOPY  07/22/2022     Exam:  BP 100/63   Pulse 80   Wt 169 lb (76.7 kg)   BMI 29.46 kg/m  Gen: Well NAD HEENT: EOMI,  MMM Lungs: Normal work of breathing. CTABL Heart: RRR no MRG Abd: NABS, Soft. Nondistended, Nontender Exts: Brisk capillary refill, warm and well perfused.  Skin: Well appearing inferior incision flap repair at the breast bilaterally.  There is eschar present predominantly over the left breast.  There is a less than 1 mm wide and less than 1 cm long slight dehiscence at the superior portion of the crescent-shaped repair that with arm motion small amounts of serous fluid drains.  She is not particularly tender with no skin air induration or erythema.  She has significant ecchymosis at the area superior lateral to the breast overlying the pectoralis.  This is tender to touch.          Assessment and Plan: 52 y.o. female with drainage appears to be serous.  I discussed with the AAP point person at the plastic surgery office.  Plan for continued wound care.  Recommend watchful waiting for the eschar.  Recheck in 1 to 2 weeks.  At that point may consider wet-to-dry dressing to aid in debridement of the eschar.  Overall she is doing quite well.   No orders of the defined types were placed in this encounter.  Meds ordered this encounter  Medications  . HYDROcodone-acetaminophen (NORCO/VICODIN) 5-325 MG tablet    Sig: Take 1 tablet by mouth every 8 (eight) hours as needed for moderate pain.    Dispense:  15 tablet    Refill:  0     Discussed warning signs or symptoms. Please see discharge instructions. Patient expresses understanding.

## 2017-07-12 ENCOUNTER — Encounter: Payer: Self-pay | Admitting: Family Medicine

## 2017-07-21 ENCOUNTER — Encounter: Payer: Self-pay | Admitting: Family Medicine

## 2017-08-18 ENCOUNTER — Encounter: Payer: Self-pay | Admitting: Family Medicine

## 2017-08-18 DIAGNOSIS — B977 Papillomavirus as the cause of diseases classified elsewhere: Secondary | ICD-10-CM | POA: Insufficient documentation

## 2017-08-18 NOTE — Progress Notes (Signed)
Received from OB/GYN On Pap smear with HPV cotesting negative cytology.  Colposcopy performed by [facility] on 08/18/17.  Awaiting pathology report

## 2017-09-10 DIAGNOSIS — S21002A Unspecified open wound of left breast, initial encounter: Secondary | ICD-10-CM | POA: Diagnosis not present

## 2017-09-30 ENCOUNTER — Telehealth: Payer: Self-pay | Admitting: Family Medicine

## 2017-09-30 MED ORDER — ALPRAZOLAM 0.5 MG PO TABS: 0.2500 mg | ORAL_TABLET | Freq: Two times a day (BID) | ORAL | 1 refills | Status: DC | PRN

## 2017-09-30 NOTE — Telephone Encounter (Signed)
Medication sent to pharmacy  

## 2017-09-30 NOTE — Telephone Encounter (Signed)
Pt called.  She is needing a refill on Xanax due to Husband passing away yesterday along with her just having surgery for breast cancer.  Please sent script to [facility] [address] in [place]. She says she shaking very badly and wants script called in asap.

## 2017-10-01 NOTE — Telephone Encounter (Signed)
Attempted to call, no ans and VM full. Unable to leave msg

## 2017-10-05 NOTE — Telephone Encounter (Signed)
Spoke to patient and advised. Graesyn Schreifels,CMA

## 2017-11-04 ENCOUNTER — Ambulatory Visit (INDEPENDENT_AMBULATORY_CARE_PROVIDER_SITE_OTHER): Payer: 59 | Admitting: Family Medicine

## 2017-11-04 DIAGNOSIS — Z23 Encounter for immunization: Secondary | ICD-10-CM

## 2017-11-04 NOTE — Progress Notes (Signed)
Patient given influenza vaccine today.

## 2017-12-07 ENCOUNTER — Telehealth: Payer: Self-pay | Admitting: Family Medicine

## 2017-12-07 MED ORDER — ALPRAZOLAM 0.5 MG PO TABS: 0.2500 mg | ORAL_TABLET | Freq: Two times a day (BID) | ORAL | 1 refills | Status: DC | PRN

## 2017-12-07 NOTE — Telephone Encounter (Signed)
Spoke to patient gave her advise as noted below. Patient stated that as soon as she returns from [facility] she will schedule an appointment to come in. Camil Wilhelmsen,CMA

## 2017-12-07 NOTE — Telephone Encounter (Signed)
Pt wanted to know if PCP would send her in an Rx for Lorazepam. States she ran out of her xanax Rx, and used some of her fathers lorazepam. Reports it worked better. Advised she should not take anyone else's Rx's, but that I would route to PCP for review.  Wants to use [facility] in [place].

## 2017-12-07 NOTE — Telephone Encounter (Signed)
We generally try to avoid lorazepam as it can be one of the most sedating and most addiction potential medication.  If you think you need lorazepam we should really see each other for a follow-up visit.  In general we should probably see each other to discuss your anxiety. Xanax refilled.

## 2017-12-07 NOTE — Telephone Encounter (Signed)
Called patient to advise but could not leave a message due to patient mailbox being full. [name],CMA

## 2017-12-28 ENCOUNTER — Encounter: Payer: Self-pay | Admitting: Family Medicine

## 2018-01-10 ENCOUNTER — Other Ambulatory Visit: Payer: Self-pay | Admitting: Family Medicine

## 2018-01-10 DIAGNOSIS — F411 Generalized anxiety disorder: Secondary | ICD-10-CM

## 2018-02-16 NOTE — Progress Notes (Deleted)
Subjective:   [name] is a 52 y.o. female who presents for an Initial Medicare Annual Wellness Visit.  Review of Systems    No ROS.  Medicare Wellness Visit. Additional risk factors are reflected in the social history.     Sleep patterns: Home Safety/Smoke Alarms: Feels safe in home. Smoke alarms in place.  Living environment;  Seat Belt Safety/Bike Helmet: Wears seat belt.   Female:   Pap-       Mammo- utd      Dexa scan-        CCS- utd      Objective:    There were no vitals filed for this visit. There is no height or weight on file to calculate BMI.  Advanced Directives 06/17/2017 08/27/2015 11/26/2014 08/28/2014  Does Patient Have a Medical Advance Directive? No No No No  Would patient like information on creating a medical advance directive? No - Patient declined No - patient declined information No - patient declined information No - patient declined information    Current Medications (verified) Outpatient Encounter Medications as of 02/21/2018  Medication Sig  . ALPRAZolam (XANAX) 0.5 MG tablet Take 0.5-1 tablets (0.25-0.5 mg total) by mouth 2 (two) times daily as needed for anxiety. for anxiety  . citalopram (CELEXA) 40 MG tablet TAKE 1 TABLET(40 MG) BY MOUTH DAILY  . cyclobenzaprine (FLEXERIL) 5 MG tablet Take 1-2 tablets (5-10 mg total) by mouth 3 (three) times daily as needed for muscle spasms.  Marland Kitchen HYDROcodone-acetaminophen (NORCO/VICODIN) 5-325 MG tablet Take 1 tablet by mouth every 8 (eight) hours as needed for moderate pain.  Marland Kitchen levothyroxine (SYNTHROID, LEVOTHROID) 50 MCG tablet TAKE 1 TABLET(50 MCG) BY MOUTH DAILY BEFORE BREAKFAST  . meloxicam (MOBIC) 15 MG tablet Take 1 tablet (15 mg total) by mouth daily as needed for pain.  . SUMAtriptan (IMITREX) 50 MG tablet TAKE 1 TABLET BY MOUTH AT FIRST SIGN OF MIGRAINE, MAY REPEAT IN 2 HOURS IF HEADACHE PERSISTS OR RECURS   Facility-Administered Encounter Medications as of 02/21/2018  Medication  .  cyanocobalamin ((VITAMIN B-12)) injection 1,000 mcg    Allergies (verified) Avelox [moxifloxacin hcl in nacl]; Barley grass; Codeine; Soy allergy; and Wheat bran   History: Past Medical History:  Diagnosis Date  . Cancer ([place])    NHL at 20  . Celiac disease   . Depression   . Family history of breast cancer   . Family history of colon cancer   . Family history of uterine cancer   . Fibromyalgia   . Folate deficiency 8/88/9169   Started on Folic Acid on 4/50/3888  . Hypothyroidism   . Hypothyroidism (acquired) 08/28/2014  . Iron deficiency anemia   . Low HDL (under 40)   . Memory difficulty 02/07/2013  . Migraine   . NHL (non-Hodgkin's lymphoma) ([place]) 07/08/2012   Stage IIA diffuse large B-cell lymphoma status post R-CHOP x6 cycles followed by involved field radiation by Dr. Nila Nephew and a mini mantle setup presenting here for the first time on 05/21/2000, finishing chemotherapy as of 09/22/2000, then radiation.    . Vitiligo    Past Surgical History:  Procedure Laterality Date  . BACK SURGERY    . COLONOSCOPY    . DIEP flap Bilateral 06/23/2017   Breast reconstruction following mastectomy   . ESOPHAGOGASTRODUODENOSCOPY    . INCONTINENCE SURGERY    . LUMBAR DISC SURGERY     artificial disc in lumbar area  . NIPPLE SPARING MASTECTOMY Bilateral 06/23/2017  With DIEP reconstruction   . TUBAL LIGATION     Family History  Problem Relation Age of Onset  . Breast cancer Maternal Aunt 70       breast ca  . Lung cancer Maternal Grandmother   . Colon cancer Maternal Grandmother 1  . Breast cancer Mother 21       breast ca  . Uterine cancer Mother 23       uterine ca  . Lung cancer Mother   . Liver cancer Maternal Grandfather    Social History   Socioeconomic History  . Marital status: Legally Separated    Spouse name: Morris  . Number of children: 2  . Years of education: 59  . Highest education level: Not on file  Occupational History    Employer:  UNEMPLOYED  Social Needs  . Financial resource strain: Not on file  . Food insecurity:    Worry: Not on file    Inability: Not on file  . Transportation needs:    Medical: Not on file    Non-medical: Not on file  Tobacco Use  . Smoking status: Never Smoker  . Smokeless tobacco: Never Used  Substance and Sexual Activity  . Alcohol use: Yes    Comment: rarely-wine  . Drug use: No  . Sexual activity: Yes  Lifestyle  . Physical activity:    Days per week: Not on file    Minutes per session: Not on file  . Stress: Not on file  Relationships  . Social connections:    Talks on phone: Not on file    Gets together: Not on file    Attends religious service: Not on file    Active member of club or organization: Not on file    Attends meetings of clubs or organizations: Not on file    Relationship status: Not on file  Other Topics Concern  . Not on file  Social History Narrative   Patient is married (Morris) and lives at home with her husband and her son.   Patient has two children.   Patient is currently not working.   Patient has a high school education.   Patient is right-handed.   Patient drinks two cups of coffee daily.    Tobacco Counseling Counseling given: Not Answered   Clinical Intake:                        Activities of Daily Living No flowsheet data found.   Immunizations and Health Maintenance Immunization History  Administered Date(s) Administered  . Influenza,inj,Quad PF,6+ Mos 11/14/2012, 11/17/2016, 11/04/2017  . Influenza-Unspecified 04/02/2016  . Td 07/20/2010   Health Maintenance Due  Topic Date Due  . PAP SMEAR-Modifier  10/25/2017    Patient Care Team: Gregor Hams, MD as PCP - General (Family Medicine)  Indicate any recent Medical Services you may have received from other than [facility] providers in the past year (date may be approximate).     Assessment:   This is a routine wellness examination for [name].Physical assessment  deferred to PCP.   Hearing/Vision screen No exam data present  Dietary issues and exercise activities discussed:   Diet  Breakfast: Lunch:  Dinner:       Goals   None    Depression Screen PHQ 2/9 Scores 06/10/2017 06/21/2013  PHQ - 2 Score 5 4  PHQ- 9 Score 16 17    Fall Risk Fall Risk  06/21/2013  Falls in the past  year? No    Is the patient's home free of loose throw rugs in walkways, pet beds, electrical cords, etc?   {Blank single:19197::"yes","no"}      Grab bars in the bathroom? {Blank single:19197::"yes","no"}      Handrails on the stairs?   {Blank single:19197::"yes","no"}      Adequate lighting?   {Blank single:19197::"yes","no"}   Cognitive Function:        Screening Tests Health Maintenance  Topic Date Due  . PAP SMEAR-Modifier  10/25/2017  . HIV Screening  06/17/2018 (Originally [birth year]/1980)  . MAMMOGRAM  05/07/2019  . TETANUS/TDAP  07/19/2020  . COLONOSCOPY  07/22/2022  . INFLUENZA VACCINE  Completed        Plan:   ***  I have personally reviewed and noted the following in the patient's chart:   . Medical and social history . Use of alcohol, tobacco or illicit drugs  . Current medications and supplements . Functional ability and status . Nutritional status . Physical activity . Advanced directives . List of other physicians . Hospitalizations, surgeries, and ER visits in previous 12 months . Vitals . Screenings to include cognitive, depression, and falls . Referrals and appointments  In addition, I have reviewed and discussed with patient certain preventive protocols, quality metrics, and best practice recommendations. A written personalized care plan for preventive services as well as general preventive health recommendations were provided to patient.     Joanne Chars, LPN   63/86/8548

## 2018-02-21 ENCOUNTER — Ambulatory Visit: Payer: 59

## 2018-03-18 ENCOUNTER — Other Ambulatory Visit: Payer: Self-pay | Admitting: Family Medicine

## 2018-03-18 ENCOUNTER — Other Ambulatory Visit: Payer: Self-pay | Admitting: Physician Assistant

## 2018-03-18 DIAGNOSIS — F411 Generalized anxiety disorder: Secondary | ICD-10-CM

## 2018-03-21 NOTE — Telephone Encounter (Signed)
Attempted to contact Pt - no answer and VM is full.

## 2018-03-21 NOTE — Telephone Encounter (Signed)
Looks like Luvenia Starch had previously been filling this but PCP switched to Dr Georgina Snell. Will send limited supply of meds but she needs folow-up with him to discuss this further since he's never Rx it.

## 2018-03-30 ENCOUNTER — Telehealth: Payer: Self-pay | Admitting: Family Medicine

## 2018-03-30 NOTE — Telephone Encounter (Signed)
I been reviewing my charts and I have noticed that you are due for Pap smear/cervical cancer screening.  Do you have an OB/GYN that you have been going to?  If not we need to get you set up for a Pap smear.  I can certainly do that myself or 1 of my female colleagues can also do that.

## 2018-03-30 NOTE — Telephone Encounter (Signed)
Attempted to contact Pt, no answer and VM is full.  

## 2018-03-30 NOTE — Telephone Encounter (Signed)
Patient has been informed. Cylis Ayars,CMA  

## 2018-04-18 ENCOUNTER — Other Ambulatory Visit: Payer: Self-pay | Admitting: Family Medicine

## 2018-04-18 DIAGNOSIS — F411 Generalized anxiety disorder: Secondary | ICD-10-CM

## 2018-04-28 IMAGING — MG DIGITAL SCREENING BILATERAL MAMMOGRAM WITH TOMO AND CAD
8 series · 9 of 24 positions shown · non-contrast
Comparison: Previous exam(s).

CLINICAL DATA: Screening.

EXAM:
DIGITAL SCREENING BILATERAL MAMMOGRAM WITH TOMO AND CAD

[R MLO synth-2D]
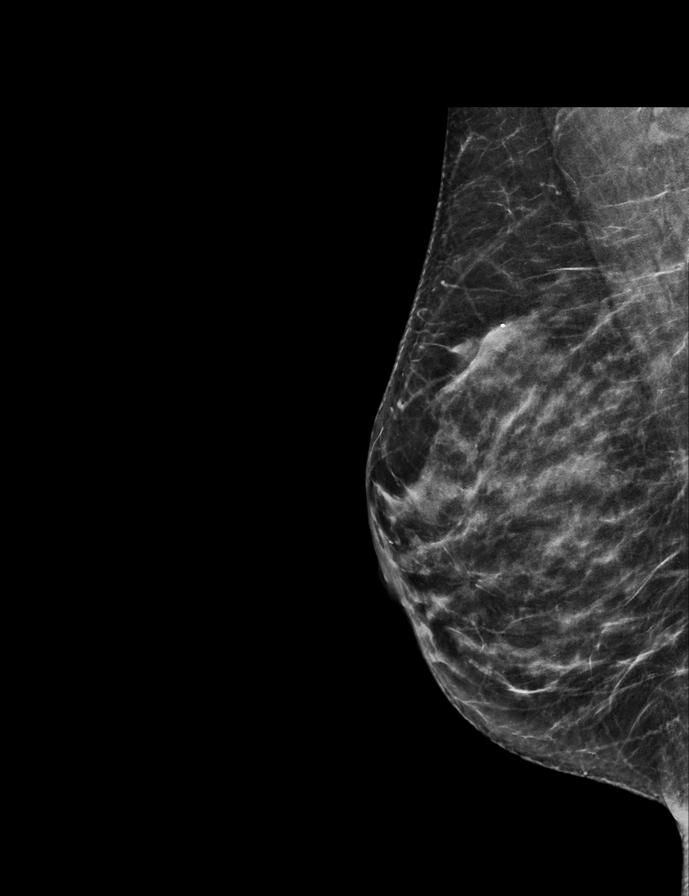

[L MLO synth-2D]
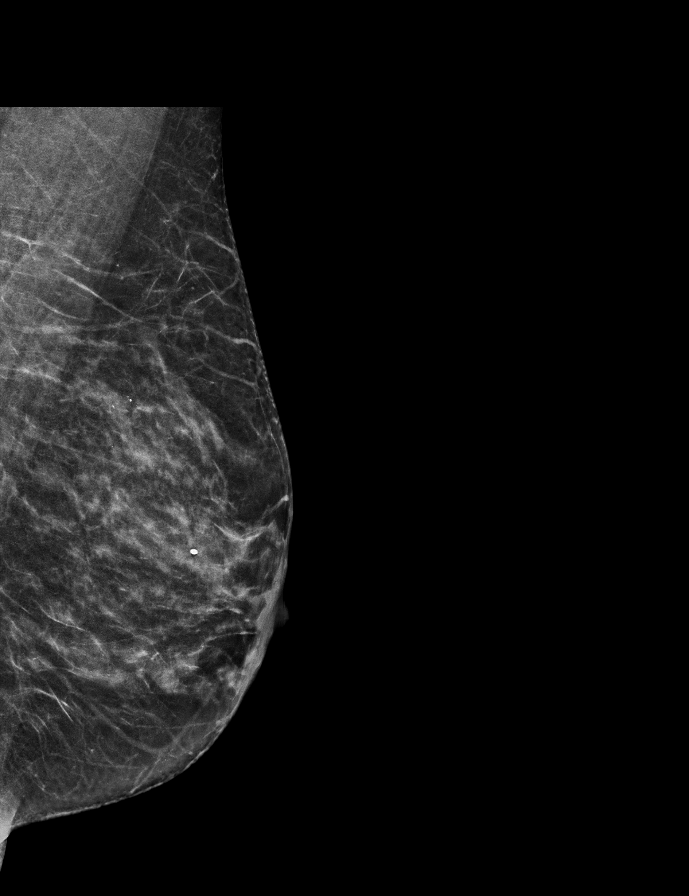

[R CC synth-2D]
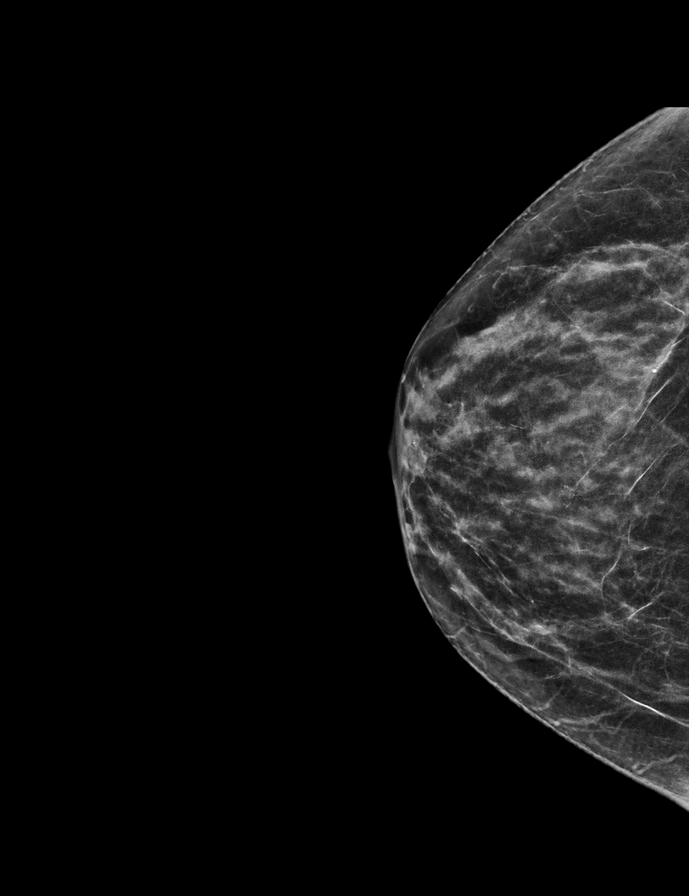

[L CC synth-2D]
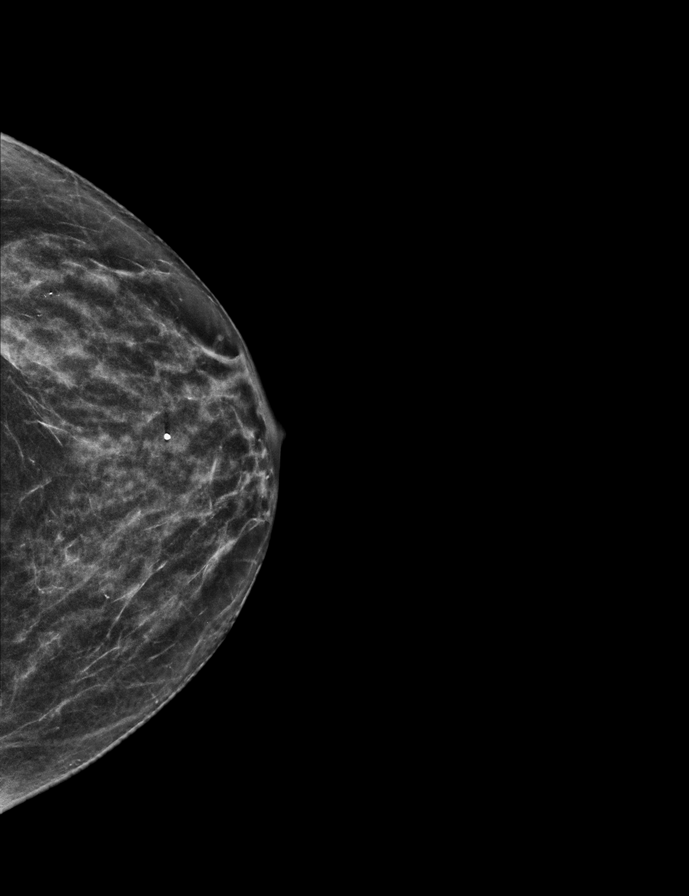

[R MLO tomo · 2 of 54 frames shown]
[frame 18/54]
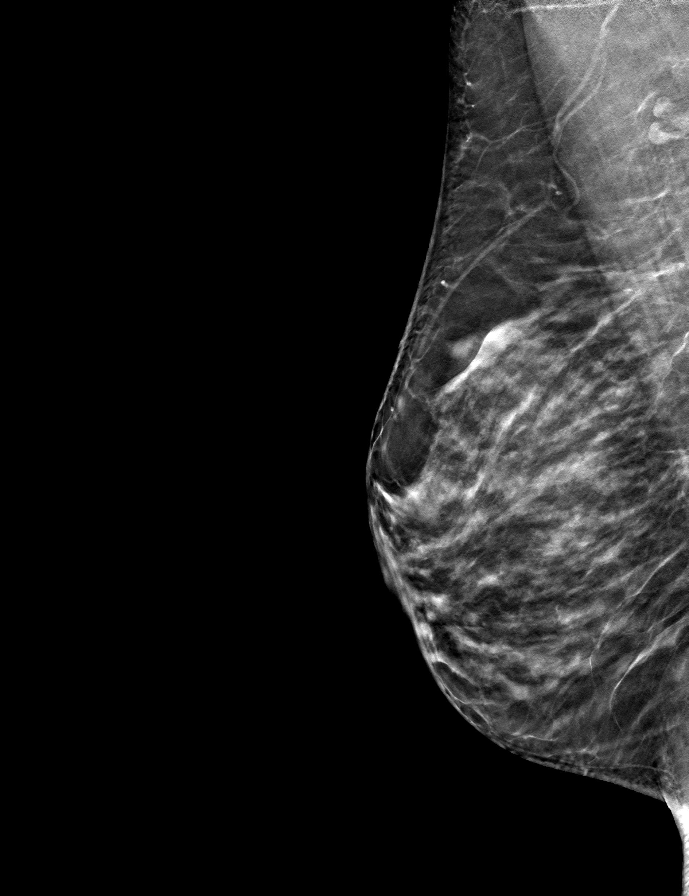
[frame 27/54]
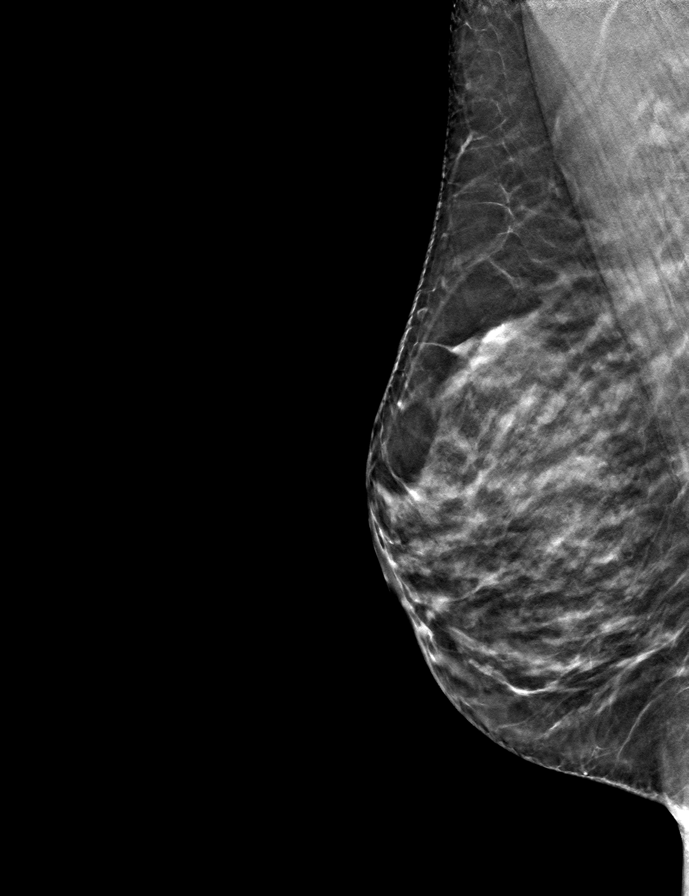

[L MLO tomo · tomo slice 27/54.0]
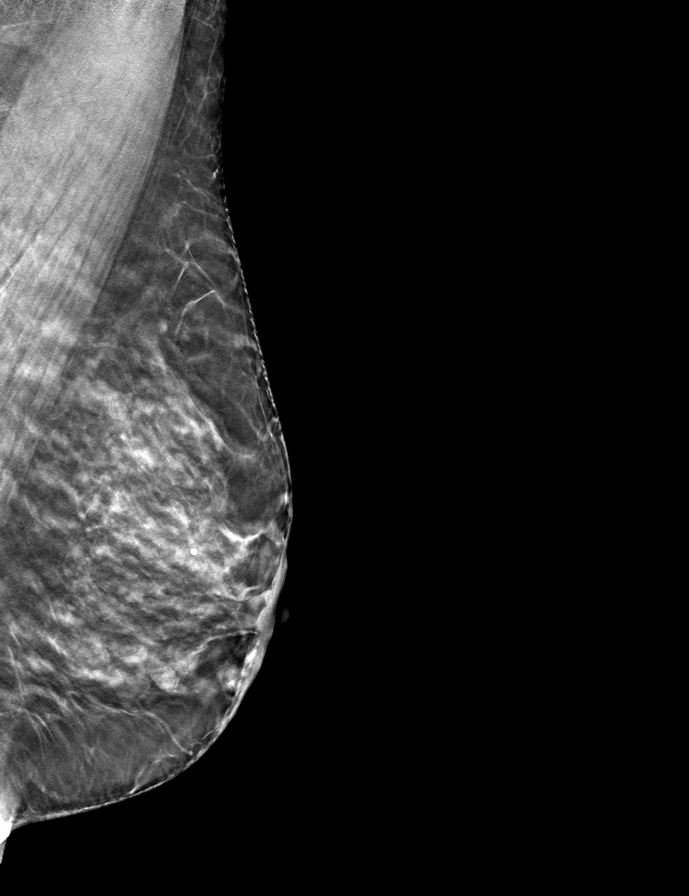

[L CC tomo · tomo slice 26/51.0]
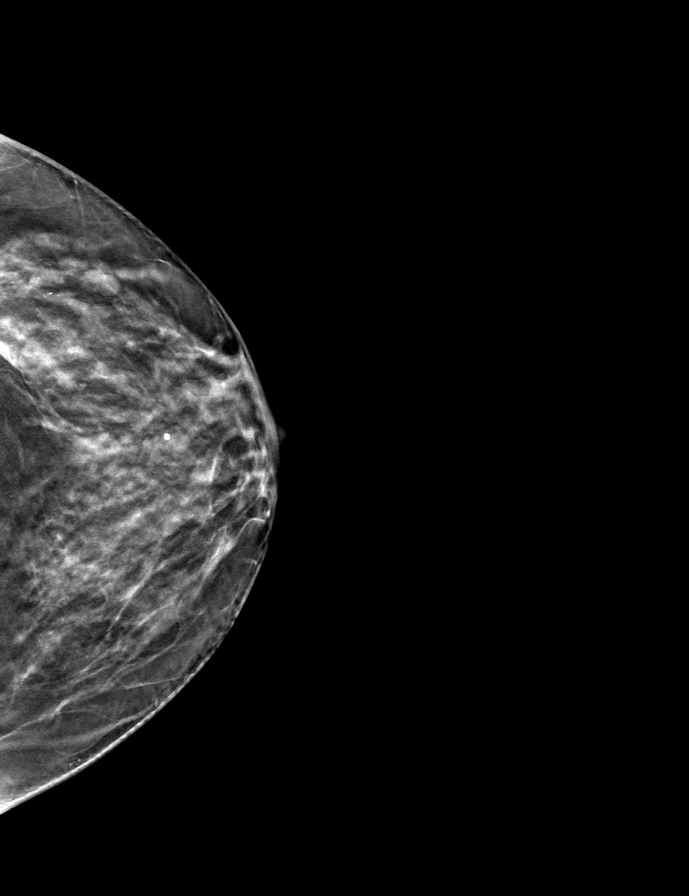

[R CC tomo · tomo slice 27/52.0]
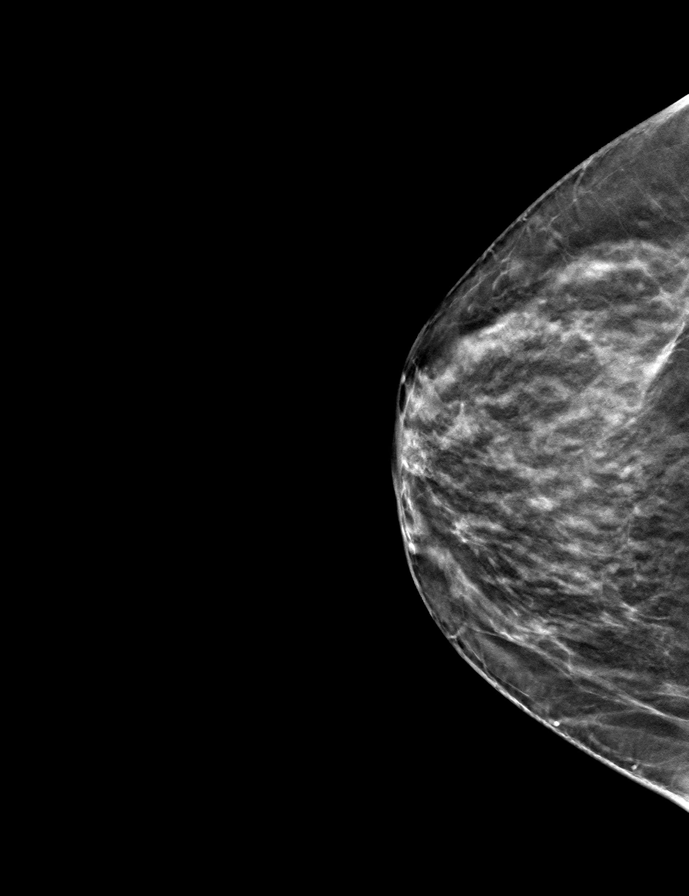

[9 of 24 positions shown; findings below may reference images not displayed]

ACR Breast Density Category c: The breast tissue is heterogeneously
dense, which may obscure small masses.
FINDINGS: In the right breast microcalcifications requires further evaluation.

In the left breast microcalcifications requires further evaluation.

Images were processed with CAD.
IMPRESSION: Further evaluation is suggested for possible microcalcifications in
the right breast.

Further evaluation is suggested for possible microcalcifications in
the left breast.

RECOMMENDATION:
Diagnostic mammogram and possibly ultrasound of both breasts.
(Code:[id number])

The patient will be contacted regarding the findings, and additional
imaging will be scheduled.

BI-RADS CATEGORY  0: Incomplete. Need additional imaging evaluation
and/or prior mammograms for comparison.

## 2018-05-12 ENCOUNTER — Telehealth: Payer: Self-pay

## 2018-05-12 MED ORDER — ALPRAZOLAM 0.5 MG PO TABS: 0.2500 mg | ORAL_TABLET | Freq: Every evening | ORAL | 1 refills | Status: AC | PRN

## 2018-05-12 NOTE — Telephone Encounter (Signed)
Patient advised of recommendations.  

## 2018-05-12 NOTE — Telephone Encounter (Signed)
Will refill for 1 more month.  Patient needs to establish care with a doctor where she lives.

## 2018-05-12 NOTE — Telephone Encounter (Signed)
[name] called and requested a refill on Xanax.  I advised that she was past due for an appointment. She states they have moved to [place], [place] and have not found a provider. Please advise.

## 2018-05-26 ENCOUNTER — Other Ambulatory Visit: Payer: Self-pay | Admitting: Family Medicine

## 2018-05-27 NOTE — Telephone Encounter (Signed)
Ambien refilled.  Patient should schedule a phone or preferably a WebEx follow-up visit to continue to provide medical care.  Please schedule WebEx or phone visit in the near future.

## 2018-05-27 NOTE — Telephone Encounter (Signed)
Attempted to contact Pt, no answer and mailbox is full.

## 2018-06-06 ENCOUNTER — Other Ambulatory Visit: Payer: Self-pay

## 2018-06-06 DIAGNOSIS — R5381 Other malaise: Secondary | ICD-10-CM

## 2018-06-07 LAB — BASIC METABOLIC PANEL
BUN: 15 (ref 4–21)
Creatinine: 0.7 (ref 0.5–1.1)
Glucose: 84
Potassium: 4.9 (ref 3.4–5.3)
Sodium: 142 (ref 137–147)

## 2018-06-07 LAB — IRON,TIBC AND FERRITIN PANEL
%SAT: 24
Ferritin: 81
Iron: 83
TIBC: 341
UIBC: 258

## 2018-06-07 LAB — HEPATIC FUNCTION PANEL
ALT: 20 (ref 7–35)
AST: 21 (ref 13–35)
Alkaline Phosphatase: 69 (ref 25–125)
Bilirubin, Total: 0.4

## 2018-06-07 LAB — CBC AND DIFFERENTIAL
HCT: 39 (ref 36–46)
Hemoglobin: 13.1 (ref 12.0–16.0)
Platelets: 404 — AB (ref 150–399)
WBC: 6.9

## 2018-06-07 LAB — TSH: TSH: 2 (ref 0.41–5.90)

## 2018-06-15 ENCOUNTER — Ambulatory Visit (INDEPENDENT_AMBULATORY_CARE_PROVIDER_SITE_OTHER): Payer: 59 | Admitting: Family Medicine

## 2018-06-15 ENCOUNTER — Other Ambulatory Visit: Payer: Self-pay

## 2018-06-15 ENCOUNTER — Encounter: Payer: Self-pay | Admitting: Family Medicine

## 2018-06-15 VITALS — Wt 169.0 lb

## 2018-06-15 DIAGNOSIS — R5383 Other fatigue: Secondary | ICD-10-CM

## 2018-06-15 DIAGNOSIS — E039 Hypothyroidism, unspecified: Secondary | ICD-10-CM | POA: Diagnosis not present

## 2018-06-15 LAB — TSH+T3+FREE T4+T3 FREE
Free T3: 2.9
Free T4: 1.33

## 2018-06-15 LAB — CALCITRIOL (1,25 DI-OH VIT D): Vit D, 1,25-Dihydroxy: 69.7

## 2018-06-15 NOTE — Progress Notes (Addendum)
Virtual Visit  via Video Note  I connected with      [name]  by a video enabled telemedicine application and verified that I am speaking with the correct person using two identifiers.   I discussed the limitations of evaluation and management by telemedicine and the availability of in person appointments. The patient expressed understanding and agreed to proceed.  History of Present Illness: [name] is a 53 y.o. female who would like to discuss fatigue.  [name] notes fatigue.  This is been ongoing for several months.  She feels generally tired throughout the day and notes that she has been taking naps during the middle of the day as well.  She had recent labs done a few week ago at [facility] which were largely normal.  She notes that she is been snoring recently and is worried that she may have sleep apnea.  Additionally she has hypothyroidism which is managed with levothyroxine.  TSH was checked recently was normal.  She denies any other skin hair or nail change.  She feels pretty well otherwise.      Observations/Objective: Wt 169 lb (76.7 kg)   BMI 29.46 kg/m  Wt Readings from Last 5 Encounters:  06/15/18 169 lb (76.7 kg)  07/08/17 169 lb (76.7 kg)  06/16/17 172 lb (78 kg)  06/10/17 172 lb (78 kg)  03/17/17 182 lb (82.6 kg)   Exam: Appearance Normal Speech.    Lab and Radiology Results Lab Results  Component Value Date   FERRITIN 81 06/07/2018   Lab Results  Component Value Date   IRON 83 06/07/2018   TIBC 341 06/07/2018   FERRITIN 81 06/07/2018   Lab Results  Component Value Date   WBC 6.9 06/07/2018   HGB 13.1 06/07/2018   HCT 39 06/07/2018   MCV 90.4 06/16/2017   PLT 404 (A) 06/07/2018     Chemistry      Component Value Date/Time   NA 142 06/07/2018   NA 136 08/27/2015 1418   K 4.9 06/07/2018   K 4.4 08/27/2015 1418   CL 102 06/16/2017 1058   CO2 29 06/16/2017 1058   CO2 25 08/27/2015 1418   BUN 15 06/07/2018   BUN 12.8 08/27/2015  1418   CREATININE 0.7 06/07/2018   CREATININE 0.72 06/16/2017 1058   CREATININE 0.8 08/27/2015 1418   GLU 84 06/07/2018      Component Value Date/Time   CALCIUM 9.4 06/16/2017 1058   CALCIUM 9.1 08/27/2015 1418   ALKPHOS 69 06/07/2018   ALKPHOS 63 08/27/2015 1418   AST 21 06/07/2018   AST 19 08/27/2015 1418   ALT 20 06/07/2018   ALT 19 08/27/2015 1418   BILITOT 0.6 04/17/2016 1221   BILITOT <0.30 08/27/2015 1418     Lab Results  Component Value Date   TSH 2.00 06/07/2018       Assessment and Plan: 53 y.o. female with  Fatigue.  Etiology somewhat unclear but I am suspicious for sleep apnea given fatigue and snoring.  Labs were checked on April 7 and were largely normal.  TSH was normal she was not anemic or iron deficient no significant electrolyte abnormality.  Plan to work-up fatigue with sleep study and recheck in the near future if not improving.  Patient at risk for nutrient deficiency given history of celiac disease, and history of breast cancer and non-Hodgkin's lymphoma.  Hypothyroidism: TSH well controlled.  Plan to continue current regimen.   Orders Placed This Encounter  Procedures  . Iron, TIBC and Ferritin Panel    This external order was created through the Results Console.  . CBC and differential    This external order was created through the Results Console.  . Basic metabolic panel    This external order was created through the Results Console.  . Hepatic function panel    This external order was created through the Results Console.  . TSH    This external order was created through the Results Console.  . Thyroid Function Panel (THS+T3+T4+Free)    This order was created through External Result Entry  . Calcitriol (1,25 di-OH Vit D)    This order was created through External Result Entry  . Home sleep test    Standing Status:   Future    Standing Expiration Date:   06/15/2019    Scheduling Instructions:     Lives in [place] full time most of  the time. Sometimes in [place]    Order Specific Question:   Where should this test be performed:    Answer:   Other   No orders of the defined types were placed in this encounter.   Follow Up Instructions:    I discussed the assessment and treatment plan with the patient. The patient was provided an opportunity to ask questions and all were answered. The patient agreed with the plan and demonstrated an understanding of the instructions.   The patient was advised to call back or seek an in-person evaluation if the symptoms worsen or if the condition fails to improve as anticipated.  I provided 25 minutes of non-face-to-face time during this encounter.    Historical information moved to improve visibility of documentation.  Past Medical History:  Diagnosis Date  . Cancer ([place])    NHL at 89  . Celiac disease   . Depression   . Family history of breast cancer   . Family history of colon cancer   . Family history of uterine cancer   . Fibromyalgia   . Folate deficiency 0/53/9767   Started on Folic Acid on 3/41/9379  . Hypothyroidism   . Hypothyroidism (acquired) 08/28/2014  . Iron deficiency anemia   . Low HDL (under 40)   . Memory difficulty 02/07/2013  . Migraine   . NHL (non-Hodgkin's lymphoma) ([place]) 07/08/2012   Stage IIA diffuse large B-cell lymphoma status post R-CHOP x6 cycles followed by involved field radiation by Dr. Nila Nephew and a mini mantle setup presenting here for the first time on 05/21/2000, finishing chemotherapy as of 09/22/2000, then radiation.    . Vitiligo    Past Surgical History:  Procedure Laterality Date  . BACK SURGERY    . COLONOSCOPY    . DIEP flap Bilateral 06/23/2017   Breast reconstruction following mastectomy   . ESOPHAGOGASTRODUODENOSCOPY    . INCONTINENCE SURGERY    . LUMBAR DISC SURGERY     artificial disc in lumbar area  . NIPPLE SPARING MASTECTOMY Bilateral 06/23/2017   With DIEP reconstruction   . TUBAL LIGATION      Social History   Tobacco Use  . Smoking status: Never Smoker  . Smokeless tobacco: Never Used  Substance Use Topics  . Alcohol use: Yes    Comment: rarely-wine   family history includes Breast cancer (age of onset: 59) in her mother; Breast cancer (age of onset: 44) in her maternal aunt; Colon cancer (age of onset: 67) in her maternal grandmother; Liver cancer in her maternal grandfather; Lung cancer  in her maternal grandmother and mother; Uterine cancer (age of onset: 52) in her mother.  Medications: Current Outpatient Medications  Medication Sig Dispense Refill  . ALPRAZolam (XANAX) 0.5 MG tablet Take 0.5-1 tablets (0.25-0.5 mg total) by mouth at bedtime as needed. 30 tablet 1  . citalopram (CELEXA) 40 MG tablet TAKE 1 TABLET(40 MG) BY MOUTH DAILY 90 tablet 0  . exemestane (AROMASIN) 25 MG tablet Take 1 tablet by mouth daily.    Marland Kitchen levothyroxine (SYNTHROID, LEVOTHROID) 50 MCG tablet TAKE 1 TABLET(50 MCG) BY MOUTH DAILY BEFORE BREAKFAST 90 tablet 0  . meloxicam (MOBIC) 15 MG tablet Take 1 tablet (15 mg total) by mouth daily as needed for pain. 30 tablet 1  . SUMAtriptan (IMITREX) 50 MG tablet TAKE 1 TABLET BY MOUTH AT FIRST SIGN OF MIGRAINE, MAY REPEAT IN 2 HOURS IF HEADACHE PERSISTS OR RECURS 12 tablet 0  . zolpidem (AMBIEN) 10 MG tablet TAKE 1 TABLET BY MOUTH AT BEDTIME 30 tablet 0   Current Facility-Administered Medications  Medication Dose Route Frequency Provider Last Rate Last Dose  . cyanocobalamin ((VITAMIN B-12)) injection 1,000 mcg  1,000 mcg Intramuscular Once Gatha Mayer, MD       Allergies  Allergen Reactions  . Avelox [Moxifloxacin Hcl In Nacl] Nausea Only  . Barley Grass   . Codeine Nausea And Vomiting  . Soy Allergy     Per patient Dr. Carlean Purl told her she should also state that she could have a soy allergy as soy and other products are grown in alternate crops and could have cross contamination. No symptoms reported.  . Wheat Bran Other (See Comments)     Damages intestines, due to Celiac disease, [place] and Barley       Addendum to correct date error due to templating issue

## 2018-06-15 NOTE — Patient Instructions (Addendum)
Thank you for coming in today.  Ok to take 1000 units of vit D3 daily.   Let me know if you do not hear about the sleep study.

## 2018-07-08 ENCOUNTER — Other Ambulatory Visit: Payer: Self-pay | Admitting: Family Medicine

## 2018-08-09 ENCOUNTER — Other Ambulatory Visit: Payer: Self-pay | Admitting: Family Medicine

## 2018-08-09 DIAGNOSIS — F411 Generalized anxiety disorder: Secondary | ICD-10-CM

## 2018-10-17 ENCOUNTER — Telehealth: Payer: Self-pay

## 2018-10-17 NOTE — Telephone Encounter (Signed)
Patient called stating she is still having fatigue. She has been thinking about possibly getting a sleep study done. Patient states you are her PCP but that is not listed in the chart.  Is it OK to schedule pt with you for tomorrow for evaluation and to get a sleep study referral?

## 2018-10-18 NOTE — Telephone Encounter (Signed)
Yes please schedule.

## 2018-10-18 NOTE — Telephone Encounter (Signed)
Called patient to schedule appointment and she will need to call back to schedule.  She needs to look at her calendar and see when she will be in town.

## 2018-11-06 ENCOUNTER — Other Ambulatory Visit: Payer: Self-pay | Admitting: Family Medicine

## 2018-11-06 DIAGNOSIS — F411 Generalized anxiety disorder: Secondary | ICD-10-CM

## 2018-11-12 ENCOUNTER — Other Ambulatory Visit: Payer: Self-pay | Admitting: Family Medicine

## 2018-11-12 DIAGNOSIS — F411 Generalized anxiety disorder: Secondary | ICD-10-CM

## 2018-11-17 ENCOUNTER — Telehealth: Payer: Self-pay | Admitting: Family Medicine

## 2018-11-17 DIAGNOSIS — F411 Generalized anxiety disorder: Secondary | ICD-10-CM

## 2018-11-17 NOTE — Telephone Encounter (Signed)
Patient calls and states she moved to [facility] but is moving back and wants you to stay her doctor. Needs a refill on her Celexa sent to [facility] in [place] on [address]. Moving back soon

## 2018-11-21 MED ORDER — CITALOPRAM HYDROBROMIDE 40 MG PO TABS: 40.0000 mg | ORAL_TABLET | Freq: Every day | ORAL | 1 refills | Status: DC

## 2018-11-21 NOTE — Telephone Encounter (Signed)
Medication refilled. Please inform patient that I am transitioning from primary care to just sports medicine in [place].  She will need to establish care with a provider where she lives.

## 2018-11-21 NOTE — Telephone Encounter (Signed)
Pt advised. She will look for a new PCP

## 2018-12-04 ENCOUNTER — Other Ambulatory Visit: Payer: Self-pay | Admitting: Family Medicine

## 2018-12-04 DIAGNOSIS — F411 Generalized anxiety disorder: Secondary | ICD-10-CM

## 2019-02-18 ENCOUNTER — Other Ambulatory Visit: Payer: Self-pay | Admitting: Family Medicine

## 2019-04-04 ENCOUNTER — Encounter: Payer: Self-pay | Admitting: Internal Medicine

## 2019-05-04 DIAGNOSIS — R87619 Unspecified abnormal cytological findings in specimens from cervix uteri: Secondary | ICD-10-CM | POA: Insufficient documentation

## 2019-05-12 ENCOUNTER — Encounter: Payer: PPO | Admitting: Internal Medicine

## 2019-05-12 ENCOUNTER — Ambulatory Visit (AMBULATORY_SURGERY_CENTER): Payer: Medicare PPO | Admitting: *Deleted

## 2019-05-12 ENCOUNTER — Other Ambulatory Visit: Payer: Self-pay

## 2019-05-12 VITALS — Ht 63.0 in | Wt 159.0 lb

## 2019-05-12 DIAGNOSIS — Z01818 Encounter for other preprocedural examination: Secondary | ICD-10-CM

## 2019-05-12 DIAGNOSIS — Z800 Family history of malignant neoplasm of digestive organs: Secondary | ICD-10-CM

## 2019-05-12 NOTE — Progress Notes (Signed)
Post op nausea and vomiting per pt.   Patient's pre-visit was done today over the phone with the patient due to COVID-19 pandemic. Name,DOB and address verified. Insurance verified. Packet of Prep instructions mailed to patient including copy of a consent form and pre-procedure patient acknowledgement form-pt is aware.  Patient understands to call us back with any questions or concerns. COVID-19 screening test is on 3/23, the patient is aware. Pt is aware that care partner will wait in the car during procedure; if they feel like they will be too hot or cold to wait in the car; they may wait in the 4 th floor lobby. Patient is aware to bring only one care partner. We want them to wear a mask (we do not have any that we can provide them), practice social distancing, and we will check their temperatures when they get here.  I did remind the patient that their care partner needs to stay in the parking lot the entire time and have a cell phone available, we will call them when the pt is ready for discharge. Patient will wear mask into building.

## 2019-05-23 ENCOUNTER — Other Ambulatory Visit: Payer: Self-pay

## 2019-05-23 ENCOUNTER — Other Ambulatory Visit (HOSPITAL_COMMUNITY)
Admission: RE | Admit: 2019-05-23 | Discharge: 2019-05-23 | Disposition: A | Discharge status: Home / Self Care | Payer: Commercial Managed Care - HMO | Source: Ambulatory Visit | Attending: Internal Medicine | Admitting: Internal Medicine

## 2019-05-23 DIAGNOSIS — Z01812 Encounter for preprocedural laboratory examination: Secondary | ICD-10-CM | POA: Diagnosis present

## 2019-05-23 DIAGNOSIS — Z20822 Contact with and (suspected) exposure to covid-19: Secondary | ICD-10-CM | POA: Diagnosis not present

## 2019-05-23 LAB — SARS CORONAVIRUS 2 (TAT 6-24 HRS): SARS Coronavirus 2: NEGATIVE

## 2019-05-26 ENCOUNTER — Ambulatory Visit (AMBULATORY_SURGERY_CENTER): Payer: 59 | Admitting: Internal Medicine

## 2019-05-26 ENCOUNTER — Other Ambulatory Visit: Payer: Self-pay

## 2019-05-26 ENCOUNTER — Encounter: Payer: Self-pay | Admitting: Internal Medicine

## 2019-05-26 VITALS — BP 118/58 | HR 74 | Temp 98.2°F | Resp 13 | Ht 63.0 in | Wt 159.0 lb

## 2019-05-26 DIAGNOSIS — Z1211 Encounter for screening for malignant neoplasm of colon: Secondary | ICD-10-CM

## 2019-05-26 DIAGNOSIS — Z800 Family history of malignant neoplasm of digestive organs: Secondary | ICD-10-CM

## 2019-05-26 DIAGNOSIS — D123 Benign neoplasm of transverse colon: Secondary | ICD-10-CM

## 2019-05-26 DIAGNOSIS — K5140 Inflammatory polyps of colon without complications: Secondary | ICD-10-CM

## 2019-05-26 MED ORDER — SODIUM CHLORIDE 0.9 % IV SOLN: 500.0000 mL | Freq: Once | INTRAVENOUS | Status: DC

## 2019-05-26 NOTE — Patient Instructions (Addendum)
I found and removed a 1 mm polyp. TINY!  All else ok  I will let you know pathology results and when to have another routine colonoscopy by mail and/or My Chart.  I appreciate the opportunity to care for you. Gatha Mayer, MD, [facility]  Please, read all of the hanoduts given to you by your recovery room nurse.  Thank-you for choosing [place] for your healthcare needs today.   YOU HAD AN ENDOSCOPIC PROCEDURE TODAY AT [facility]:   Refer to the procedure report that was given to you for any specific questions about what was found during the examination.  If the procedure report does not answer your questions, please call your gastroenterologist to clarify.  If you requested that your care partner not be given the details of your procedure findings, then the procedure report has been included in a sealed envelope for you to review at your convenience later.  YOU SHOULD EXPECT: Some feelings of bloating in the abdomen. Passage of more gas than usual.  Walking can help get rid of the air that was put into your GI tract during the procedure and reduce the bloating. If you had a lower endoscopy (such as a colonoscopy or flexible sigmoidoscopy) you may notice spotting of blood in your stool or on the toilet paper. If you underwent a bowel prep for your procedure, you may not have a normal bowel movement for a few days.  Please Note:  You might notice some irritation and congestion in your nose or some drainage.  This is from the oxygen used during your procedure.  There is no need for concern and it should clear up in a day or so.  SYMPTOMS TO REPORT IMMEDIATELY:   Following lower endoscopy (colonoscopy or flexible sigmoidoscopy):  Excessive amounts of blood in the stool  Significant tenderness or worsening of abdominal pains  Swelling of the abdomen that is new, acute  Fever of 100F or higher  For urgent or emergent issues, a gastroenterologist can be reached at any hour by calling  [phone]. Do not use MyChart messaging for urgent concerns.    DIET:  We do recommend a small meal at first, but then you may proceed to your regular diet.  Drink plenty of fluids but you should avoid alcoholic beverages for 24 hours.  ACTIVITY:  You should plan to take it easy for the rest of today and you should NOT DRIVE or use heavy machinery until tomorrow (because of the sedation medicines used during the test).    FOLLOW UP: Our staff will call the number listed on your records 48-72 hours following your procedure to check on you and address any questions or concerns that you may have regarding the information given to you following your procedure. If we do not reach you, we will leave a message.  We will attempt to reach you two times.  During this call, we will ask if you have developed any symptoms of COVID 19. If you develop any symptoms (ie: fever, flu-like symptoms, shortness of breath, cough etc.) before then, please call [phone].  If you test positive for Covid 19 in the 2 weeks post procedure, please call and report this information to [place].    If any biopsies were taken you will be contacted by phone or by letter within the next 1-3 weeks.  Please call us at [phone] if you have not heard about the biopsies in 3 weeks.    SIGNATURES/CONFIDENTIALITY: You  and/or your care partner have signed paperwork which will be entered into your electronic medical record.  These signatures attest to the fact that that the information above on your After Visit Summary has been reviewed and is understood.  Full responsibility of the confidentiality of this discharge information lies with you and/or your care-partner.

## 2019-05-26 NOTE — Progress Notes (Signed)
Pt's states no medical or surgical changes since previsit or office visit.  CS - temp Dt - vitals

## 2019-05-26 NOTE — Op Note (Signed)
[place] Patient Name: [name] Procedure Date: 05/26/2019 1:31 PM MRN: [id number] Endoscopist: Gatha Mayer , MD Age: 54 Referring MD:  Date of Birth: [birth year] Gender: Female Account #: [id number] Procedure:                Colonoscopy Indications:              Screening for colon cancer: Family history of                            colorectal cancer in distant relative(s) Medicines:                Propofol per Anesthesia, Monitored Anesthesia Care Procedure:                Pre-Anesthesia Assessment:                           - Prior to the procedure, a History and Physical                            was performed, and patient medications and                            allergies were reviewed. The patient's tolerance of                            previous anesthesia was also reviewed. The risks                            and benefits of the procedure and the sedation                            options and risks were discussed with the patient.                            All questions were answered, and informed consent                            was obtained. Prior Anticoagulants: The patient has                            taken no previous anticoagulant or antiplatelet                            agents. ASA Grade Assessment: II - A patient with                            mild systemic disease. After reviewing the risks                            and benefits, the patient was deemed in                            satisfactory condition to undergo the procedure.  After obtaining informed consent, the colonoscope                            was passed under direct vision. Throughout the                            procedure, the patient's blood pressure, pulse, and                            oxygen saturations were monitored continuously. The                            Colonoscope was introduced through the anus and   advanced to the the cecum, identified by                            appendiceal orifice and ileocecal valve. The                            colonoscopy was performed without difficulty. The                            patient tolerated the procedure well. The quality                            of the bowel preparation was good. The bowel                            preparation used was Miralax via split dose                            instruction. The ileocecal valve, appendiceal                            orifice, and rectum were photographed. Scope In: 1:40:30 PM Scope Out: 1:54:09 PM Scope Withdrawal Time: 0 hours 9 minutes 42 seconds  Total Procedure Duration: 0 hours 13 minutes 39 seconds  Findings:                 The perianal and digital rectal examinations were                            normal.                           A 1 mm polyp was found in the transverse colon. The                            polyp was sessile. The polyp was removed with a                            cold biopsy forceps. Resection and retrieval were                            complete. Verification of  patient identification                            for the specimen was done. Estimated blood loss was                            minimal.                           The exam was otherwise without abnormality on                            direct and retroflexion views. Complications:            No immediate complications. Estimated Blood Loss:     Estimated blood loss was minimal. Impression:               - One 1 mm polyp in the transverse colon, removed                            with a cold biopsy forceps. Resected and retrieved.                           - The examination was otherwise normal on direct                            and retroflexion views.                           - FHx CRCA grandmother age 7, mother w/ uterine                            cancer - genetics negative so far Recommendation:            - Patient has a contact number available for                            emergencies. The signs and symptoms of potential                            delayed complications were discussed with the                            patient. Return to normal activities tomorrow.                            Written discharge instructions were provided to the                            patient.                           - Resume previous diet.                           - Continue present medications.                           -  Repeat colonoscopy is recommended. The                            colonoscopy date will be determined after pathology                            results from today's exam become available for                            review. Gatha Mayer, MD 05/26/2019 2:04:00 PM This report has been signed electronically.

## 2019-05-26 NOTE — Progress Notes (Signed)
To PACU, VSS. Report to [email or web address] 

## 2019-05-26 NOTE — Progress Notes (Signed)
Called to room to assist during endoscopic procedure.  Patient ID and intended procedure confirmed with present staff. Received instructions for my participation in the procedure from the performing physician.  

## 2019-05-30 ENCOUNTER — Telehealth: Payer: Self-pay | Admitting: *Deleted

## 2019-05-30 NOTE — Telephone Encounter (Signed)
  Follow up Call-  Call back number 05/26/2019  Post procedure Call Back phone  # [phone]  Permission to leave phone message Yes  Some recent data might be hidden     Patient questions:  Mailbox is full.

## 2019-05-30 NOTE — Telephone Encounter (Signed)
Mailbox full unable to leave message.

## 2019-06-01 ENCOUNTER — Encounter: Payer: Self-pay | Admitting: Internal Medicine

## 2021-03-02 HISTORY — PX: OTHER SURGICAL HISTORY: SHX169

## 2021-11-17 ENCOUNTER — Ambulatory Visit
Admission: EM | Admit: 2021-11-17 | Discharge: 2021-11-17 | Disposition: A | Discharge status: Home / Self Care | Payer: 59 | Attending: Family Medicine | Admitting: Family Medicine

## 2021-11-17 DIAGNOSIS — S0502XA Injury of conjunctiva and corneal abrasion without foreign body, left eye, initial encounter: Secondary | ICD-10-CM | POA: Diagnosis not present

## 2021-11-17 MED ORDER — ERYTHROMYCIN 5 MG/GM OP OINT: TOPICAL_OINTMENT | OPHTHALMIC | 0 refills | Status: DC

## 2021-11-17 NOTE — ED Provider Notes (Signed)
[facility]    CSN: [id number] Arrival date & time: 11/17/21  1317      History   Chief Complaint Chief Complaint  Patient presents with   Eye Problem    HPI [name] is a 56 y.o. female.   Patient presenting today with 1 day history of left-sided eye redness, irritation and foreign body sensation.  States she was doing her make-up yesterday and got some mascara in her eye and feels like there is still a piece under her eyelid.  She tried eye flushes, eyedrops and cool compresses with no relief.  Denies any visual change, drainage, headache.     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 Nila Nephew and a mini mantle setup presenting here for the first time on 05/21/2000, finishing chemotherapy as of 09/22/2000, then radiation.     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, estrogen receptor positive ([place]) 05/18/2017   Skin mass 03/09/2017   Sesamoiditis 09/07/2016   Migraine without aura and without status migrainosus, not intractable 04/19/2016   Autoimmune disorder ([place]) 09/06/2015   Fatigue 08/27/2015    Vitamin D deficiency 08/27/2015   B12 deficiency 08/27/2015   Iron deficiency anemia 08/27/2015   Generalized anxiety disorder 03/22/2015   Insomnia 03/22/2015   Genetic testing 10/05/2014   Family history of uterine cancer    Family history of colon cancer    Family history of breast cancer in female 08/29/2014   Hypothyroidism (acquired) 08/28/2014   IBS (irritable bowel syndrome) 10/30/2013   Tremor 08/08/2013   Memory difficulty 02/07/2013   Fibromyalgia 02/07/2013   Vitiligo    Thoracic spine pain 12/21/2012   Celiac disease 11/16/2012   History of systemic steroid therapy 11/16/2012   Folate deficiency 07/09/2012   History of non-Hodgkin's lymphoma 07/08/2012   Neuropathic pain 06/06/2012    Past Surgical History:  Procedure Laterality Date   BACK SURGERY     COLONOSCOPY     DIEP flap Bilateral 06/23/2017   Breast reconstruction following mastectomy    ESOPHAGOGASTRODUODENOSCOPY     INCONTINENCE SURGERY     LUMBAR DISC SURGERY     artificial disc in lumbar area   NIPPLE SPARING MASTECTOMY Bilateral 06/23/2017   With DIEP reconstruction    TUBAL LIGATION      OB History   No obstetric history on file.      Home Medications    Prior to Admission medications   Medication Sig Start Date  End Date Taking? Authorizing Provider  erythromycin ophthalmic ointment Place a 1/2 inch ribbon of ointment into the left lower eyelid BID prn. 11/17/21  Yes Volney American, PA-C  ALPRAZolam Duanne Moron) 0.5 MG tablet Take 0.5-1 tablets (0.25-0.5 mg total) by mouth at bedtime as needed. 05/12/18   Gregor Hams, MD  Cholecalciferol (VITAMIN D3 PO) Take by mouth.    [provider]  escitalopram (LEXAPRO) 20 MG tablet Take 20 mg by mouth daily.    [provider]  exemestane (AROMASIN) 25 MG tablet Take 1 tablet by mouth daily. 05/20/18   [provider]  levothyroxine (SYNTHROID) 50 MCG tablet TAKE 1 TABLET BY MOUTH DAILY BEFORE BREAKFAST 08/09/18    Gregor Hams, MD  MAGNESIUM PO Take by mouth.    [provider]  meloxicam (MOBIC) 15 MG tablet Take 1 tablet (15 mg total) by mouth daily as needed for pain. 08/28/16   Breeback, Royetta Car, PA-C  Multiple Vitamin (MULTIVITAMIN) tablet Take 1 tablet by mouth daily.    [provider]  SUMAtriptan (IMITREX) 50 MG tablet TAKE 1 TABLET BY MOUTH AT FIRST SIGN OF MIGRAINE, MAY REPEAT IN 2 HOURS IF HEADACHE PERSISTS OR RECURS 04/02/17   Breeback, Jade L, PA-C  Zinc Sulfate (ZINC 15 PO) Take by mouth.    [provider]  zolpidem (AMBIEN) 10 MG tablet TAKE 1 TABLET BY MOUTH EVERY DAY AT BEDTIME 07/11/18   Gregor Hams, MD    Family History Family History  Problem Relation Age of Onset   Breast cancer Maternal Aunt 70       breast ca   Lung cancer Maternal Grandmother    Colon cancer Maternal Grandmother 47   Colon polyps Maternal Grandmother    Breast cancer Mother 41       breast ca   Uterine cancer Mother 82       uterine ca   Lung cancer Mother    Colon polyps Mother    Liver cancer Maternal Grandfather    Esophageal cancer Neg Hx    Rectal cancer Neg Hx    Stomach cancer Neg Hx     Social History Social History   Tobacco Use   Smoking status: Never   Smokeless tobacco: Never  Vaping Use   Vaping Use: Never used  Substance Use Topics   Alcohol use: Yes    Comment: rarely-wine   Drug use: No     Allergies   Avelox [moxifloxacin hcl in nacl], Barley grass, Codeine, Soy allergy, and Wheat bran   Review of Systems Review of Systems Per HPI  Physical Exam Triage Vital Signs ED Triage Vitals  Enc Vitals Group     BP 11/17/21 1454 135/85     Pulse Rate 11/17/21 1454 79     Resp 11/17/21 1454 16     Temp 11/17/21 1454 98.2 F (36.8 C)     Temp Source 11/17/21 1454 Oral     SpO2 11/17/21 1454 96 %     Weight --      Height --      Head Circumference --      Peak Flow --      Pain Score 11/17/21 1501 0     Pain Loc --      Pain Edu? --       Excl. in [place]? --    No data found.  Updated Vital Signs BP 135/85 (BP Location: Right Arm)   Pulse 79  Temp 98.2 F (36.8 C) (Oral)   Resp 16   LMP 08/14/2015   SpO2 96%   Visual Acuity Right Eye Distance: 20/30 Left Eye Distance: 20/40 Bilateral Distance: 20/25  Right Eye Near:   Left Eye Near:    Bilateral Near:     Physical Exam Vitals and nursing note reviewed.  Constitutional:      Appearance: Normal appearance. She is not ill-appearing.  HENT:     Head: Atraumatic.  Eyes:     Extraocular Movements: Extraocular movements intact.     Pupils: Pupils are equal, round, and reactive to light.     Comments: 2 abrasions with increased uptake to the lateral conjunctiva noted on fluorescein stain today.  No foreign body on exam  Cardiovascular:     Rate and Rhythm: Normal rate and regular rhythm.     Heart sounds: Normal heart sounds.  Pulmonary:     Effort: Pulmonary effort is normal.     Breath sounds: Normal breath sounds.  Musculoskeletal:        General: Normal range of motion.     Cervical back: Normal range of motion and neck supple.  Skin:    General: Skin is warm and dry.  Neurological:     Mental Status: She is alert and oriented to person, place, and time.  Psychiatric:        Mood and Affect: Mood normal.        Thought Content: Thought content normal.        Judgment: Judgment normal.      UC Treatments / Results  Labs (all labs ordered are listed, but only abnormal results are displayed) Labs Reviewed - No data to display  EKG   Radiology No results found.  Procedures Procedures (including critical care time)  Medications Ordered in UC Medications - No data to display  Initial Impression / Assessment and Plan / UC Course  I have reviewed the triage vital signs and the nursing notes.  Pertinent labs & imaging results that were available during my care of the patient were reviewed by me and considered in my medical decision making  (see chart for details).     We will treat with erythromycin ointment, cool compresses and ophthalmology follow-up if worsening or not resolving.  Visual acuity reassuring today.  No foreign body appreciable on exam.  Final Clinical Impressions(s) / UC Diagnoses   Final diagnoses:  Abrasion of left cornea, initial encounter   Discharge Instructions   None    ED Prescriptions     Medication Sig Dispense Auth. Provider   erythromycin ophthalmic ointment Place a 1/2 inch ribbon of ointment into the left lower eyelid BID prn. 3.5 g Volney American, PA-C      PDMP not reviewed this encounter.   Volney American, [place] 11/17/21 1615

## 2021-11-17 NOTE — ED Triage Notes (Signed)
Pt was trying to break up her mascara yesterday with the wand on her lashes on her left eye. She felt like her eye felt weird. This morning she woke up and it is red and she feels like something is under her eye lid.

## 2023-07-13 ENCOUNTER — Encounter: Payer: Self-pay | Admitting: Gastroenterology

## 2023-08-18 ENCOUNTER — Ambulatory Visit: Admitting: Gastroenterology

## 2023-08-20 ENCOUNTER — Encounter: Payer: Self-pay | Admitting: Gastroenterology

## 2023-08-20 ENCOUNTER — Other Ambulatory Visit

## 2023-08-20 ENCOUNTER — Ambulatory Visit: Admitting: Gastroenterology

## 2023-08-20 VITALS — BP 108/72 | HR 83 | Ht 63.0 in | Wt 166.5 lb

## 2023-08-20 DIAGNOSIS — R6881 Early satiety: Secondary | ICD-10-CM | POA: Diagnosis not present

## 2023-08-20 DIAGNOSIS — Z800 Family history of malignant neoplasm of digestive organs: Secondary | ICD-10-CM

## 2023-08-20 DIAGNOSIS — R09A2 Foreign body sensation, throat: Secondary | ICD-10-CM | POA: Diagnosis not present

## 2023-08-20 DIAGNOSIS — Z853 Personal history of malignant neoplasm of breast: Secondary | ICD-10-CM

## 2023-08-20 DIAGNOSIS — R140 Abdominal distension (gaseous): Secondary | ICD-10-CM

## 2023-08-20 DIAGNOSIS — R630 Anorexia: Secondary | ICD-10-CM | POA: Diagnosis not present

## 2023-08-20 DIAGNOSIS — K900 Celiac disease: Secondary | ICD-10-CM

## 2023-08-20 DIAGNOSIS — C50419 Malignant neoplasm of upper-outer quadrant of unspecified female breast: Secondary | ICD-10-CM

## 2023-08-20 DIAGNOSIS — K5900 Constipation, unspecified: Secondary | ICD-10-CM

## 2023-08-20 DIAGNOSIS — Z170 Estrogen receptor positive status [ER+]: Secondary | ICD-10-CM

## 2023-08-20 DIAGNOSIS — K8681 Exocrine pancreatic insufficiency: Secondary | ICD-10-CM

## 2023-08-20 DIAGNOSIS — Z8572 Personal history of non-Hodgkin lymphomas: Secondary | ICD-10-CM

## 2023-08-20 DIAGNOSIS — R194 Change in bowel habit: Secondary | ICD-10-CM

## 2023-08-20 DIAGNOSIS — E039 Hypothyroidism, unspecified: Secondary | ICD-10-CM

## 2023-08-20 DIAGNOSIS — K219 Gastro-esophageal reflux disease without esophagitis: Secondary | ICD-10-CM

## 2023-08-20 LAB — CBC WITH DIFFERENTIAL/PLATELET
Basophils Absolute: 0.1 10*3/uL (ref 0.0–0.1)
Basophils Relative: 1.3 % (ref 0.0–3.0)
Eosinophils Absolute: 0.2 10*3/uL (ref 0.0–0.7)
Eosinophils Relative: 3.4 % (ref 0.0–5.0)
HCT: 38.3 % (ref 36.0–46.0)
Hemoglobin: 12.8 g/dL (ref 12.0–15.0)
Lymphocytes Relative: 30.7 % (ref 12.0–46.0)
Lymphs Abs: 1.8 10*3/uL (ref 0.7–4.0)
MCHC: 33.4 g/dL (ref 30.0–36.0)
MCV: 92 fl (ref 78.0–100.0)
Monocytes Absolute: 0.8 10*3/uL (ref 0.1–1.0)
Monocytes Relative: 13.3 % — ABNORMAL HIGH (ref 3.0–12.0)
Neutro Abs: 3.1 10*3/uL (ref 1.4–7.7)
Neutrophils Relative %: 51.3 % (ref 43.0–77.0)
Platelets: 420 10*3/uL — ABNORMAL HIGH (ref 150.0–400.0)
RBC: 4.16 Mil/uL (ref 3.87–5.11)
RDW: 13.7 % (ref 11.5–15.5)
WBC: 6 10*3/uL (ref 4.0–10.5)

## 2023-08-20 LAB — COMPREHENSIVE METABOLIC PANEL WITH GFR
ALT: 21 U/L (ref 0–35)
AST: 20 U/L (ref 0–37)
Albumin: 4.5 g/dL (ref 3.5–5.2)
Alkaline Phosphatase: 58 U/L (ref 39–117)
BUN: 13 mg/dL (ref 6–23)
CO2: 28 meq/L (ref 19–32)
Calcium: 9.2 mg/dL (ref 8.4–10.5)
Chloride: 104 meq/L (ref 96–112)
Creatinine, Ser: 0.78 mg/dL (ref 0.40–1.20)
GFR: 83.97 mL/min (ref >60.00)
Glucose, Bld: 96 mg/dL (ref 70–99)
Potassium: 4 meq/L (ref 3.5–5.1)
Sodium: 138 meq/L (ref 135–145)
Total Bilirubin: 0.5 mg/dL (ref 0.2–1.2)
Total Protein: 7.3 g/dL (ref 6.0–8.3)

## 2023-08-20 LAB — TSH: TSH: 1.89 u[IU]/mL (ref 0.35–5.50)

## 2023-08-20 MED ORDER — NA SULFATE-K SULFATE-MG SULF 17.5-3.13-1.6 GM/177ML PO SOLN: 1.0000 | Freq: Once | ORAL | 0 refills | Status: AC

## 2023-08-20 MED ORDER — PANTOPRAZOLE SODIUM 40 MG PO TBEC: 40.0000 mg | DELAYED_RELEASE_TABLET | Freq: Every day | ORAL | 3 refills | Status: AC

## 2023-08-20 NOTE — Progress Notes (Signed)
 [name] [id number] 1965-09-20   Chief Complaint: Diarrhea/loose stools  Referring Provider: No ref. provider found Primary GI MD: Dr. Willy Harvest  HPI: [name] is a 58 y.o. female with past medical history of breast cancer 2019 s/p double mastectomy, celiac disease, family history of colon cancer, folate deficiency, hypothyroidism, IDA, migraines, non-Hodgkin's lymphoma s/p R-CHOP x 6 cycles and radiation and completed chemo 2002, who presents today for a complaint of diarrhea.    Last seen in office 10/30/2013 by Dr. Willy Harvest for celiac disease and IBS.  She was scheduled for repeat EGD which was later canceled by patient.  TTG elevated in 2014, normal in 2015.  Patient states she had 14 weeks of diarrhea had a workup for this with negative stool studies.  Stools were loose and without formed but not watery.  She would have 1 loose stool daily with associated urgency.  Diarrhea eventually resolved, then then she began having thin stools for a couple of weeks.  Now she has having constipation, abdominal bloating, occasional abdominal cramps.  She was seen at ED in [facility] 08/11/2023 for cramping and sharp pain in RLQ. Labs showed WBC 11.3 with elevated neutrophils. CT A/P negative. She was given Zofran , Bentyl, IV fluids.  She states that the night before her ED visit, she had right lower quadrant pain.  In the morning she woke up with abdominal cramping and multiple bowel movements but did not have any diarrhea.  She started feeling better in the ED before they gave her any medication.  Since then she has had a generalized feeling of fullness, some occasional abdominal cramping, bloating.  Abdomen feels tight.  She has not been seeing any blood in her stool.  She is taking MiraLAX daily, has a bowel movement every 3 days.  Prior to onset of diarrhea several weeks ago, her bowel movements were regular and she would have 1 almost every day.  Stools were formed.  She typically  does not have diarrhea unless she is sick.  States she does not really feel constipated right now, just feels bloated and has a lot of gas which is unusual for her.  She has been compliant with gluten-free diet and has also avoided dairy.  The only noticeable change in her lifestyle, diet, or medications prior to onset of diarrhea was that she started taking omeprazole for globus sensation she was experiencing.  Had had some congestion and postnasal drip, but had a feeling of something being in her throat.  She took omeprazole for about a month before noticing diarrhea.   She has not restarted omeprazole, but she has been having some acid reflux and increased belching recently.  Globus sensation persists and occurs daily.  She has had decreased appetite and early satiety which she attributes to bloating.  Patient's maternal grandmother had colon cancer at age 74.  Patient has a personal history of breast cancer and non-Hodgkin's lymphoma.   Previous GI Procedures/Imaging   Colonoscopy 05/26/2019 - One 1 mm polyp in the transverse colon, removed with a cold biopsy forceps. Resected and retrieved.  - The examination was otherwise normal on direct and retroflexion views. - FHx CRCA grandmother age 79, mother w/ uterine cancer - genetics negative so far - Recall 5 years Path: Surgical [P], colon, transverse, polyp - INFLAMMATORY POLYP - NEGATIVE FOR DYSPLASIA  Colonoscopy 07/21/2012 - Normal colon - Normal mucosa in the terminal ileum - Repeat 5 years  EGD 07/21/2012 - Abnormal mucosa (question celiac changes)  was found in the duodenal bulb and second part of the duodenum, multiple biopsies - The remainder of the upper endoscopy exam was otherwise normal Path: Surgical [P], duodenal, bx - BENIGN DUODENAL MUCOSA WITH GASTRIC FOVEOLAR METAPLASIA, SEE COMMENT. - NO EVIDENCE OF ACUTE INFLAMMATION, DYSPLASIA OR MALIGNANCY. The findings in the duodenal biopsy favor peptic injury. Drug induced  injury is also possible however less likely. The findings are not those of celiac disease. Please correlate with clinical and endoscopic impression.  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 Hercules Lombard and a mini mantle setup presenting here for the first time on 05/21/2000, finishing chemotherapy as of 09/22/2000, then radiation.     Post-operative nausea and vomiting    Vitiligo     Past Surgical History:  Procedure Laterality Date   BACK SURGERY     COLONOSCOPY     DIEP flap Bilateral 06/23/2017   Breast reconstruction following mastectomy    ESOPHAGOGASTRODUODENOSCOPY     INCONTINENCE SURGERY     LUMBAR DISC SURGERY     artificial disc in lumbar area   NIPPLE SPARING MASTECTOMY Bilateral 06/23/2017   With DIEP reconstruction    TUBAL LIGATION      Current Outpatient Medications  Medication Sig Dispense Refill   ALPRAZolam  (XANAX ) 0.5 MG tablet Take 0.5-1 tablets (0.25-0.5 mg total) by mouth at bedtime as needed. 30 tablet 1   Cholecalciferol (VITAMIN D3 PO) Take by mouth.     erythromycin  ophthalmic ointment Place a 1/2 inch ribbon of ointment into the left lower eyelid BID prn. 3.5 g 0   escitalopram (LEXAPRO) 20 MG tablet Take 20 mg by mouth daily.     exemestane (AROMASIN) 25 MG tablet Take 1 tablet by mouth daily.     levothyroxine  (SYNTHROID ) 50 MCG  tablet TAKE 1 TABLET BY MOUTH DAILY BEFORE BREAKFAST 90 tablet 1   MAGNESIUM PO Take by mouth.     meloxicam  (MOBIC ) 15 MG tablet Take 1 tablet (15 mg total) by mouth daily as needed for pain. 30 tablet 1   Multiple Vitamin (MULTIVITAMIN) tablet Take 1 tablet by mouth daily.     SUMAtriptan  (IMITREX ) 50 MG tablet TAKE 1 TABLET BY MOUTH AT FIRST SIGN OF MIGRAINE, MAY REPEAT IN 2 HOURS IF HEADACHE PERSISTS OR RECURS 12 tablet 0   Zinc Sulfate (ZINC 15 PO) Take by mouth.     zolpidem  (AMBIEN ) 10 MG tablet TAKE 1 TABLET BY MOUTH EVERY DAY AT BEDTIME 30 tablet 4   No current facility-administered medications for this visit.    Allergies as of 08/20/2023 - Review Complete 11/17/2021  Allergen Reaction Noted   Avelox [moxifloxacin hcl in nacl] Nausea Only 07/02/2012   Barley grass  02/17/2013  Codeine Nausea And Vomiting 07/10/2011   Soy allergy (obsolete)  02/17/2013   Wheat Other (See Comments) 02/07/2013    Family History  Problem Relation Age of Onset   Breast cancer Maternal Aunt 50       breast ca   Lung cancer Maternal Grandmother    Colon cancer Maternal Grandmother 47   Colon polyps Maternal Grandmother    Breast cancer Mother 67       breast ca   Uterine cancer Mother 73       uterine ca   Lung cancer Mother    Colon polyps Mother    Liver cancer Maternal Grandfather    Esophageal cancer Neg Hx    Rectal cancer Neg Hx    Stomach cancer Neg Hx     Social History   Tobacco Use   Smoking status: Never   Smokeless tobacco: Never  Vaping Use   Vaping status: Never Used  Substance Use Topics   Alcohol use: Yes    Comment: rarely-wine   Drug use: No     Review of Systems:    Constitutional: No weight loss, fever, chills, weakness or fatigue Skin: No rash or itching Cardiovascular: No chest pain, chest pressure or palpitations   Respiratory: No SOB or cough Gastrointestinal: See HPI and otherwise negative Genitourinary: No dysuria or change in urinary  frequency Neurological: No headache, dizziness or syncope Musculoskeletal: No new muscle or joint pain Hematologic: No bleeding or bruising    Physical Exam:  Vital signs: BP 108/72   Pulse 83   Ht 5' 3 (1.6 m)   Wt 166 lb 8 oz (75.5 kg)   LMP 08/14/2015   BMI 29.49 kg/m    Constitutional: NAD, Well developed, Well nourished, alert and cooperative Head:  Normocephalic and atraumatic.  Eyes: No scleral icterus. Conjunctiva pink. Mouth: No oral lesions. Respiratory: Respirations even and unlabored. Lungs clear to auscultation bilaterally.  No wheezes, crackles, or rhonchi.  Cardiovascular:  Regular rate and rhythm. No murmurs. No peripheral edema. Gastrointestinal:  Soft, nondistended, generalized discomfort but no pain on palpation. No rebound or guarding. Normal bowel sounds. No appreciable masses or hepatomegaly. Rectal:  Deferred to colonoscopy. Neurologic:  Alert and oriented x4;  grossly normal neurologically.  Skin:   Dry and intact without significant lesions or rashes. Psychiatric: Oriented to person, place and time. Demonstrates good judgement and reason without abnormal affect or behaviors.   RELEVANT LABS AND IMAGING: CBC    Component Value Date/Time   WBC 6.9 06/07/2018 0000   WBC 8.6 06/16/2017 1058   RBC 4.08 06/16/2017 1058   HGB 13.1 06/07/2018 0000   HGB 12.7 08/27/2015 1306   HCT 39 06/07/2018 0000   HCT 38.8 08/27/2015 1306   PLT 404 (A) 06/07/2018 0000   PLT 416 (H) 08/27/2015 1306   MCV 90.4 06/16/2017 1058   MCV 93.5 08/27/2015 1306   MCH 30.9 06/16/2017 1058   MCHC 34.1 06/16/2017 1058   RDW 13.1 06/16/2017 1058   RDW 14.3 08/27/2015 1306   LYMPHSABS 1,856 04/17/2016 1221   LYMPHSABS 1.7 08/27/2015 1306   MONOABS 754 04/17/2016 1221   MONOABS 1.2 (H) 08/27/2015 1306   EOSABS 232 04/17/2016 1221   EOSABS 0.3 08/27/2015 1306   BASOSABS 58 04/17/2016 1221   BASOSABS 0.1 08/27/2015 1306    CMP     Component Value Date/Time   NA 142  06/07/2018 0000   NA 136 08/27/2015 1418   K 4.9 06/07/2018 0000  K 4.4 08/27/2015 1418   CL 102 06/16/2017 1058   CO2 29 06/16/2017 1058   CO2 25 08/27/2015 1418   GLUCOSE 88 06/16/2017 1058   GLUCOSE 89 08/27/2015 1418   BUN 15 06/07/2018 0000   BUN 12.8 08/27/2015 1418   CREATININE 0.7 06/07/2018 0000   CREATININE 0.72 06/16/2017 1058   CREATININE 0.8 08/27/2015 1418   CALCIUM 9.4 06/16/2017 1058   CALCIUM 9.1 08/27/2015 1418   PROT 6.9 04/17/2016 1221   PROT 7.1 08/27/2015 1418   ALBUMIN 4.2 04/17/2016 1221   ALBUMIN 3.7 08/27/2015 1418   AST 21 06/07/2018 0000   AST 19 08/27/2015 1418   ALT 20 06/07/2018 0000   ALT 19 08/27/2015 1418   ALKPHOS 69 06/07/2018 0000   ALKPHOS 63 08/27/2015 1418   BILITOT 0.6 04/17/2016 1221   BILITOT <0.30 08/27/2015 1418   GFRNONAA 97 06/16/2017 1058   GFRAA 112 06/16/2017 1058     Assessment/Plan:   Change in bowel habits Abdominal bloating Early satiety/decreased appetite Globus sensation/GERD Celiac disease Family history of colon cancer - maternal grandmother age 6 History of breast cancer, non-Hodgkin's lymphoma Patient with recent change in bowel habits.  Had 14 weeks of diarrhea with stool studies not revealing infection.  This progressed to 2 weeks of thin stools, followed by constipation which she is still experiencing somewhat despite daily MiraLAX.  Having a bowel movement about once every 3 days.  Has abdominal bloating/fullness, early satiety, decreased appetite, though she reports her weight is stable. Recently seen in the ED for cramping and RLQ pain. Labs showed WBC 11.3 with elevated neutrophils. CT A/P negative. Has been adherent to gluten-free diet and is also avoiding dairy. Has been having globus sensation and reflux/burning chest pain as well.  Patient would like to have endoscopic evaluation which I think is reasonable given her present symptoms and history. Last colonoscopy 05/2019 with 5-year recall  recommended. Last EGD 06/2012.  - Schedule EGD/colonoscopy. I thoroughly discussed the procedure with the patient to include nature of the procedure, alternatives, benefits, and risks (including but not limited to bleeding, infection, perforation, anesthesia/cardiac/pulmonary complications). Patient verbalized understanding and gave verbal consent to proceed with procedure.  - Check labs: CBC, CMP, TSH, TTG, IgA - Continue gluten-free diet - Continue MiraLAX 1 capful daily.  Can increase to 1 capful twice daily if needed. - Start fiber supplement - Start pantoprazole 40mg  daily   Valiant Gaul, PA-C [facility] 08/20/2023, 8:30 AM  Patient Care Team: Patient, No Pcp Per as PCP - General (General Practice)

## 2023-08-20 NOTE — Patient Instructions (Addendum)
 _______________________________________________________  If your blood pressure at your visit was 140/90 or greater, please contact your primary care physician to follow up on this.  _______________________________________________________  If you are age 58 or older, your body mass index should be between 23-30. Your Body mass index is 29.49 kg/m. If this is out of the aforementioned range listed, please consider follow up with your Primary Care Provider.  If you are age 61 or younger, your body mass index should be between 19-25. Your Body mass index is 29.49 kg/m. If this is out of the aformentioned range listed, please consider follow up with your Primary Care Provider.   ________________________________________________________  The  GI providers would like to encourage you to use MYCHART to communicate with providers for non-urgent requests or questions.  Due to long hold times on the telephone, sending your provider a message by [facility] may be a faster and more efficient way to get a response.  Please allow 48 business hours for a response.  Please remember that this is for non-urgent requests.  _______________________________________________________  Your provider has requested that you go to the basement level for lab work before leaving today. Press B on the elevator. The lab is located at the first door on the left as you exit the elevator.  We have sent the following medications to your pharmacy for you to pick up at your convenience: Suprep Protonix  Continue miralax daily. Start benefiber. Gradually increase to 1 tablespoon daily  You have been scheduled for an endoscopy and colonoscopy. Please follow the written instructions given to you at your visit today.  If you use inhalers (even only as needed), please bring them with you on the day of your procedure.  DO NOT TAKE 7 DAYS PRIOR TO TEST- Trulicity (dulaglutide) Ozempic, Wegovy (semaglutide) Mounjaro  (tirzepatide) Bydureon Bcise (exanatide extended release)  DO NOT TAKE 1 DAY PRIOR TO YOUR TEST Rybelsus (semaglutide) Adlyxin (lixisenatide) Victoza  (liraglutide ) Byetta (exanatide) ___________________________________________________________________________  It was a pleasure to see you today!  Thank you for trusting me with your gastrointestinal care!

## 2023-08-21 LAB — IGA: Immunoglobulin A: 300 mg/dL (ref 47–310)

## 2023-08-21 LAB — TISSUE TRANSGLUTAMINASE, IGA: (tTG) Ab, IgA: <1 U/mL

## 2023-08-23 ENCOUNTER — Ambulatory Visit: Payer: Self-pay | Admitting: Gastroenterology

## 2023-09-28 ENCOUNTER — Encounter: Payer: Self-pay | Admitting: Internal Medicine

## 2023-09-28 ENCOUNTER — Telehealth: Payer: Self-pay | Admitting: Internal Medicine

## 2023-09-28 MED ORDER — NA SULFATE-K SULFATE-MG SULF 17.5-3.13-1.6 GM/177ML PO SOLN: 1.0000 | Freq: Once | ORAL | 0 refills | Status: AC

## 2023-09-28 NOTE — Telephone Encounter (Signed)
 Inbound call from patient stating she has not received prep medication for upcoming endo/colon on 8/5 would like to know if they can take the pill prep instead of the liquid and would like it sent to this pharmacy [facility] pharmacy [address] [place], [place] [address]   Please advise  Thank you

## 2023-09-28 NOTE — Telephone Encounter (Signed)
 Spoke with patient. Discussed Sutab. Explained to patient, that it doesn't always do a good job with clean out, and there is a risk that she may have to repeat her colonoscopy. Patient verbalized understanding. States she will stick with Suprep. Order sent to requested pharmacy.

## 2023-09-28 NOTE — Telephone Encounter (Signed)
 Attempted to reach patient. No answer, left VM for patient to return call.

## 2023-10-04 DIAGNOSIS — K3189 Other diseases of stomach and duodenum: Secondary | ICD-10-CM | POA: Diagnosis not present

## 2023-10-04 DIAGNOSIS — K529 Noninfective gastroenteritis and colitis, unspecified: Secondary | ICD-10-CM | POA: Diagnosis not present

## 2023-10-04 NOTE — Progress Notes (Unsigned)
 [facility] History and Physical   Primary Care Physician:  Patient, No Pcp Per   Reason for Procedure:    Encounter Diagnoses  Name Primary?   Celiac disease Yes   Change in bowel habits    Family history of colon cancer    Gastroesophageal reflux disease, unspecified whether esophagitis present    Globus sensation      Plan:    EGD and colonoscopy     HPI: [name] is a 58 y.o. female  with past medical history of breast cancer 2019 s/p double mastectomy, celiac disease, family history of colon cancer, folate deficiency, hypothyroidism, IDA, migraines, non-Hodgkin's lymphoma s/p R-CHOP x 6 cycles and radiation and completed chemo 2002.  She was seen in the clinic by Camie Furbish PA-C on June 20 due to change in bowel habits and with the following assessment and plan:  Change in bowel habits Abdominal bloating Early satiety/decreased appetite Globus sensation/GERD Celiac disease Family history of colon cancer - maternal grandmother age 29 History of breast cancer, non-Hodgkin's lymphoma Patient with recent change in bowel habits.  Had 14 weeks of diarrhea with stool studies not revealing infection.  This progressed to 2 weeks of thin stools, followed by constipation which she is still experiencing somewhat despite daily MiraLAX.  Having a bowel movement about once every 3 days.  Has abdominal bloating/fullness, early satiety, decreased appetite, though she reports her weight is stable. Recently seen in the ED for cramping and RLQ pain. Labs showed WBC 11.3 with elevated neutrophils. CT A/P negative. Has been adherent to gluten-free diet and is also avoiding dairy. Has been having globus sensation and reflux/burning chest pain as well.   Patient would like to have endoscopic evaluation which I think is reasonable given her present symptoms and history. Last colonoscopy 05/2019 with 5-year recall recommended. Last EGD 06/2012.  CBC, CMET, IgA level and tissue  transglutaminase antibody IgA were all normal except she had a platelet count of 420 which was somewhat elevated. He was told to continue MiraLAX and add a fiber supplement.  Pantoprazole  40 mg daily was started.  To maintain gluten-free diet also.  Past Medical History:  Diagnosis Date   Arthritis    Breast cancer (HCC) 2019   double mastectomy    Cancer (HCC)    NHL at 82   Celiac disease    Depression    Family history of breast cancer    Family history of colon cancer    Family history of uterine cancer    Fibromyalgia    Folate deficiency 07/09/2012   Started on Folic Acid  on 07/10/2012   Hypothyroidism    Hypothyroidism (acquired) 08/28/2014   Iron deficiency anemia    Low HDL (under 40)    Memory difficulty 02/07/2013   Migraine    NHL (non-Hodgkin's lymphoma) (HCC) 07/08/2012   Stage IIA diffuse large B-cell lymphoma status post R-CHOP x6 cycles followed by involved field radiation by Dr. Malachi Runner and a mini mantle setup presenting here for the first time on 05/21/2000, finishing chemotherapy as of 09/22/2000, then radiation.     Post-operative nausea and vomiting    Vitiligo     Past Surgical History:  Procedure Laterality Date   BACK SURGERY     breast resconstruction  2023   COLONOSCOPY     DIEP flap Bilateral 06/23/2017   Breast reconstruction following mastectomy    ESOPHAGOGASTRODUODENOSCOPY     INCONTINENCE SURGERY     LUMBAR DISC SURGERY  artificial disc in lumbar area   NIPPLE SPARING MASTECTOMY Bilateral 06/23/2017   With DIEP reconstruction    TUBAL LIGATION       Current Outpatient Medications  Medication Sig Dispense Refill   escitalopram (LEXAPRO) 20 MG tablet Take 20 mg by mouth daily.     levothyroxine  (SYNTHROID ) 50 MCG tablet TAKE 1 TABLET BY MOUTH DAILY BEFORE BREAKFAST 90 tablet 1   UBRELVY 100 MG TABS Take 1 tablet by mouth daily as needed.     zolpidem  (AMBIEN ) 10 MG tablet TAKE 1 TABLET BY MOUTH EVERY DAY AT BEDTIME (Patient  taking differently: Take 10 mg by mouth at bedtime as needed.) 30 tablet 4   ALPRAZolam  (XANAX ) 0.5 MG tablet Take 0.5-1 tablets (0.25-0.5 mg total) by mouth at bedtime as needed. 30 tablet 1   buPROPion (WELLBUTRIN XL) 150 MG 24 hr tablet Take 1 tablet by mouth every morning.     Multiple Vitamin (MULTIVITAMIN) tablet Take 1 tablet by mouth daily. (Patient not taking: No sig reported)     pantoprazole  (PROTONIX ) 40 MG tablet Take 1 tablet (40 mg total) by mouth daily. 30 tablet 3   Polyethylene Glycol 3350  (MIRALAX PO) Take 17 g by mouth daily.     Current Facility-Administered Medications  Medication Dose Route Frequency Provider Last Rate Last Admin   0.9 %  sodium chloride  infusion  500 mL Intravenous Once Avram Lupita BRAVO, MD        Allergies as of 10/05/2023 - Review Complete 10/05/2023  Allergen Reaction Noted   Avelox [moxifloxacin hcl in nacl] Nausea Only 07/02/2012   Barley grass Other (See Comments) 02/17/2013   Codeine Nausea And Vomiting 07/10/2011   Oxycodone Other (See Comments) 08/20/2023   Soy allergy (obsolete) Other (See Comments) 02/17/2013   Wheat Other (See Comments) 02/07/2013    Family History  Problem Relation Age of Onset   Breast cancer Maternal Aunt 70       breast ca   Lung cancer Maternal Grandmother    Colon cancer Maternal Grandmother 47   Colon polyps Maternal Grandmother    Breast cancer Mother 59       breast ca   Uterine cancer Mother 23       uterine ca   Lung cancer Mother    Colon polyps Mother    Liver cancer Maternal Grandfather    Esophageal cancer Neg Hx    Rectal cancer Neg Hx    Stomach cancer Neg Hx     Social History   Socioeconomic History   Marital status: Legally Separated    Spouse name: Morris   Number of children: 2   Years of education: 12   Highest education level: Not on file  Occupational History    Employer: UNEMPLOYED  Tobacco Use   Smoking status: Never   Smokeless tobacco: Never  Vaping Use   Vaping  status: Never Used  Substance and Sexual Activity   Alcohol use: Yes    Comment: rarely-wine   Drug use: No   Sexual activity: Yes  Other Topics Concern   Not on file  Social History Narrative   Patient is married (Morris) and lives at home with her husband and her son.   Patient has two children.   Patient is currently not working.   Patient has a high school education.   Patient is right-handed.   Patient drinks two cups of coffee daily.   Social Drivers of Corporate investment banker Strain: Not on  file  Food Insecurity: Not on file  Transportation Needs: Not on file  Physical Activity: Not on file  Stress: Not on file  Social Connections: Unknown (07/14/2021)   [facility] from [facility]   Social Network    Social Network: Not on file  Intimate Partner Violence: Unknown (06/05/2021)   Received from [facility]   HITS    Physically Hurt: Not on file    Insult or Talk Down To: Not on file    Threaten Physical Harm: Not on file    Scream or Curse: Not on file    Review of Systems:  All other review of systems negative except as mentioned in the HPI.  Physical Exam: Vital signs BP 139/78   Pulse 77   Temp 98.2 F (36.8 C)   Ht 5' 3 (1.6 m)   Wt 166 lb (75.3 kg)   LMP 08/14/2015   SpO2 98%   BMI 29.41 kg/m   General:   Alert,  Well-developed, well-nourished, pleasant and cooperative in NAD Lungs:  Clear throughout to auscultation.   Heart:  Regular rate and rhythm; no murmurs, clicks, rubs,  or gallops. Abdomen:  Soft, nontender and nondistended. Normal bowel sounds.   Neuro/Psych:  Alert and cooperative. Normal mood and affect. A and O x 3   @Jensyn Shave  CHARLENA Commander, MD, [facility] [phone] (pager) 10/05/2023 3:46 PM@

## 2023-10-05 ENCOUNTER — Ambulatory Visit (AMBULATORY_SURGERY_CENTER): Admitting: Internal Medicine

## 2023-10-05 ENCOUNTER — Encounter: Payer: Self-pay | Admitting: Internal Medicine

## 2023-10-05 VITALS — BP 145/71 | HR 85 | Temp 98.2°F | Resp 14 | Ht 63.0 in | Wt 166.0 lb

## 2023-10-05 DIAGNOSIS — Z8542 Personal history of malignant neoplasm of other parts of uterus: Secondary | ICD-10-CM | POA: Diagnosis not present

## 2023-10-05 DIAGNOSIS — R194 Change in bowel habit: Secondary | ICD-10-CM

## 2023-10-05 DIAGNOSIS — R09A2 Foreign body sensation, throat: Secondary | ICD-10-CM

## 2023-10-05 DIAGNOSIS — K648 Other hemorrhoids: Secondary | ICD-10-CM | POA: Diagnosis not present

## 2023-10-05 DIAGNOSIS — K900 Celiac disease: Secondary | ICD-10-CM

## 2023-10-05 DIAGNOSIS — Z800 Family history of malignant neoplasm of digestive organs: Secondary | ICD-10-CM | POA: Diagnosis not present

## 2023-10-05 DIAGNOSIS — K3189 Other diseases of stomach and duodenum: Secondary | ICD-10-CM

## 2023-10-05 DIAGNOSIS — K219 Gastro-esophageal reflux disease without esophagitis: Secondary | ICD-10-CM

## 2023-10-05 DIAGNOSIS — D123 Benign neoplasm of transverse colon: Secondary | ICD-10-CM

## 2023-10-05 MED ORDER — SODIUM CHLORIDE 0.9 % IV SOLN: 500.0000 mL | Freq: Once | INTRAVENOUS | Status: DC

## 2023-10-05 MED ORDER — HYOSCYAMINE SULFATE 0.125 MG SL SUBL: 0.1250 mg | SUBLINGUAL_TABLET | SUBLINGUAL | 3 refills | Status: AC | PRN

## 2023-10-05 NOTE — Op Note (Signed)
 [facility] Patient Name: [name] Procedure Date: 10/05/2023 3:57 PM MRN: [id number] Endoscopist: Lupita FORBES Commander , MD, [id number] Age: 58 Referring MD:  Date of Birth: [birth year] Gender: Female Account #: [id number] Procedure:                Colonoscopy Indications:              Change in bowel habits Medicines:                Monitored Anesthesia Care Procedure:                Pre-Anesthesia Assessment:                           - Prior to the procedure, a History and Physical                            was performed, and patient medications and                            allergies were reviewed. The patient's tolerance of                            previous anesthesia was also reviewed. The risks                            and benefits of the procedure and the sedation                            options and risks were discussed with the patient.                            All questions were answered, and informed consent                            was obtained. Prior Anticoagulants: The patient has                            taken no anticoagulant or antiplatelet agents. ASA                            Grade Assessment: II - A patient with mild systemic                            disease. After reviewing the risks and benefits,                            the patient was deemed in satisfactory condition to                            undergo the procedure.                           After obtaining informed consent, the colonoscope  was passed under direct vision. Throughout the                            procedure, the patient's blood pressure, pulse, and                            oxygen saturations were monitored continuously. The                            Olympus Scope SN [id number] was introduced through the                            anus and advanced to the the terminal ileum, with                            identification of the appendiceal  orifice and IC                            valve. The colonoscopy was performed without                            difficulty. The patient tolerated the procedure                            well. The quality of the bowel preparation was                            good. The terminal ileum, ileocecal valve,                            appendiceal orifice, and rectum were photographed.                            The bowel preparation used was SUPREP via split                            dose instruction. Scope In: 4:14:00 PM Scope Out: 4:27:29 PM Scope Withdrawal Time: 0 hours 9 minutes 42 seconds  Total Procedure Duration: 0 hours 13 minutes 29 seconds  Findings:                 The perianal and digital rectal examinations were                            normal.                           A 6 mm polyp was found in the hepatic flexure. The                            polyp was sessile. The polyp was removed with a                            cold snare. Resection and retrieval were complete.  Verification of patient identification for the                            specimen was done.                           Internal hemorrhoids were found. The hemorrhoids                            were small.                           The terminal ileum appeared normal.                           The exam was otherwise without abnormality on                            direct and retroflexion views. Complications:            No immediate complications. Estimated Blood Loss:     Estimated blood loss was minimal. Impression:               - One 6 mm polyp at the hepatic flexure, removed                            with a cold snare. Resected and retrieved.                           - Internal hemorrhoids.                           - The examined portion of the ileum was normal.                           - The examination was otherwise normal on direct                            and  retroflexion views.                           - Maternal grandmother had CRCA at 67 and mother                            had uterine cancer - genetic testing negative Recommendation:           - Patient has a contact number available for                            emergencies. The signs and symptoms of potential                            delayed complications were discussed with the                            patient. Return to normal activities tomorrow.  Written discharge instructions were provided to the                            patient.                           - Resume previous diet.                           - Continue present medications.                           - Await pathology results.                           - Repeat colonoscopy is recommended for                            surveillance. The colonoscopy date will be                            determined after pathology results from today's                            exam become available for review. Lupita FORBES Commander, MD 10/05/2023 4:42:53 PM This report has been signed electronically.

## 2023-10-05 NOTE — Progress Notes (Signed)
 Called to room to assist during endoscopic procedure.  Patient ID and intended procedure confirmed with present staff. Received instructions for my participation in the procedure from the performing physician.

## 2023-10-05 NOTE — Op Note (Signed)
 [facility] Patient Name: [name] Procedure Date: 10/05/2023 3:58 PM MRN: [id number] Endoscopist: Lupita FORBES Commander , MD, [id number] Age: 58 Referring MD:  Date of Birth: 01-16-1966 Gender: Female Account #: [id number] Procedure:                Upper GI endoscopy Indications:              Suspected esophageal reflux, Celiac disease,                            Follow-up of celiac disease, also has some globus Medicines:                Monitored Anesthesia Care Procedure:                Pre-Anesthesia Assessment:                           - Prior to the procedure, a History and Physical                            was performed, and patient medications and                            allergies were reviewed. The patient's tolerance of                            previous anesthesia was also reviewed. The risks                            and benefits of the procedure and the sedation                            options and risks were discussed with the patient.                            All questions were answered, and informed consent                            was obtained. Prior Anticoagulants: The patient has                            taken no anticoagulant or antiplatelet agents. ASA                            Grade Assessment: II - A patient with mild systemic                            disease. After reviewing the risks and benefits,                            the patient was deemed in satisfactory condition to                            undergo the procedure.  After obtaining informed consent, the endoscope was                            passed under direct vision. Throughout the                            procedure, the patient's blood pressure, pulse, and                            oxygen saturations were monitored continuously. The                            Olympus Scope [id number] was introduced through the                            mouth,  and advanced to the second part of duodenum.                            The upper GI endoscopy was accomplished without                            difficulty. The patient tolerated the procedure                            well. Scope In: Scope Out: Findings:                 The esophagus was normal.                           The stomach was normal.                           The examined duodenum was normal.                           The cardia and gastric fundus were normal on                            retroflexion.                           Biopsies for histology were taken with a cold                            forceps in the entire duodenum for evaluation of                            celiac disease. Verification of patient                            identification for the specimen was done. Estimated                            blood loss was minimal. Complications:            No  immediate complications. Estimated blood loss:                            Minimal. Estimated Blood Loss:     Estimated blood loss was minimal. Impression:               - Normal esophagus.                           - Normal stomach.                           - Normal examined duodenum.                           - Biopsies were taken with a cold forceps for                            evaluation of celiac disease. Jar 1 is D2 and Jar 2                            is bulb Recommendation:           - Patient has a contact number available for                            emergencies. The signs and symptoms of potential                            delayed complications were discussed with the                            patient. Return to normal activities tomorrow.                            Written discharge instructions were provided to the                            patient.                           - Resume previous diet.                           - Continue present medications.                           -  See the other procedure note for documentation of                            additional recommendations. Lupita FORBES Commander, MD 10/05/2023 4:38:41 PM This report has been signed electronically.

## 2023-10-05 NOTE — Patient Instructions (Addendum)
 The upper endoscopy exam looked normal.  I did take biopsies of the upper intestine (duodenum) to check up on the celiac disease further.  Colonoscopy revealed a polyp, it was small and benign-appearing and was removed.  There were internal hemorrhoids also.  I will let you know pathology results and when to have another routine colonoscopy by mail and/or My Chart.  I appreciate the opportunity to care for you. Lupita CHARLENA Commander, MD, [facility]  Resume previous diet.  Continue present medications.  Awaiting pathology results.  Repeat colonoscopy is recommended for surveillance. The colonoscopy date will be determined after pathology results from today's exam.  Handouts provided on polyps   YOU HAD AN ENDOSCOPIC PROCEDURE TODAY AT THE [facility]:   Refer to the procedure report that was given to you for any specific questions about what was found during the examination.  If the procedure report does not answer your questions, please call your gastroenterologist to clarify.  If you requested that your care partner not be given the details of your procedure findings, then the procedure report has been included in a sealed envelope for you to review at your convenience later.  YOU SHOULD EXPECT: Some feelings of bloating in the abdomen. Passage of more gas than usual.  Walking can help get rid of the air that was put into your GI tract during the procedure and reduce the bloating. If you had a lower endoscopy (such as a colonoscopy or flexible sigmoidoscopy) you may notice spotting of blood in your stool or on the toilet paper. If you underwent a bowel prep for your procedure, you may not have a normal bowel movement for a few days.  Please Note:  You might notice some irritation and congestion in your nose or some drainage.  This is from the oxygen used during your procedure.  There is no need for concern and it should clear up in a day or so.  SYMPTOMS TO REPORT IMMEDIATELY:  Following  lower endoscopy (colonoscopy or flexible sigmoidoscopy):  Excessive amounts of blood in the stool  Significant tenderness or worsening of abdominal pains  Swelling of the abdomen that is new, acute  Fever of 100F or higher  Following upper endoscopy (EGD)  Vomiting of blood or coffee ground material  New chest pain or pain under the shoulder blades  Painful or persistently difficult swallowing  New shortness of breath  Fever of 100F or higher  Black, tarry-looking stools  For urgent or emergent issues, a gastroenterologist can be reached at any hour by calling [phone]. Do not use MyChart messaging for urgent concerns.    DIET:  We do recommend a small meal at first, but then you may proceed to your regular diet.  Drink plenty of fluids but you should avoid alcoholic beverages for 24 hours.  ACTIVITY:  You should plan to take it easy for the rest of today and you should NOT DRIVE or use heavy machinery until tomorrow (because of the sedation medicines used during the test).    FOLLOW UP: Our staff will call the number listed on your records the next business day following your procedure.  We will call around 7:15- 8:00 am to check on you and address any questions or concerns that you may have regarding the information given to you following your procedure. If we do not reach you, we will leave a message.     If any biopsies were taken you will be contacted by phone or by  letter within the next 1-3 weeks.  Please call us  at [phone] if you have not heard about the biopsies in 3 weeks.    SIGNATURES/CONFIDENTIALITY: You and/or your care partner have signed paperwork which will be entered into your electronic medical record.  These signatures attest to the fact that that the information above on your After Visit Summary has been reviewed and is understood.  Full responsibility of the confidentiality of this discharge information lies with you and/or your care-partner.

## 2023-10-05 NOTE — Progress Notes (Signed)
 Vss nad trans to pacu

## 2023-10-05 NOTE — Progress Notes (Signed)
 Pt's states no medical or surgical changes since previsit or office visit.

## 2023-10-06 ENCOUNTER — Telehealth: Payer: Self-pay

## 2023-10-06 NOTE — Telephone Encounter (Signed)
 Follow up call to pt, no answer.

## 2023-10-07 LAB — SURGICAL PATHOLOGY

## 2023-10-10 ENCOUNTER — Ambulatory Visit: Payer: Self-pay | Admitting: Internal Medicine
